# Patient Record
Sex: Female | Born: 1940
Health system: Southern US, Community
[De-identification: ages and names within clinical notes are randomized; demographics above are authoritative.]

## PROBLEM LIST (undated history)

## (undated) DIAGNOSIS — H9190 Unspecified hearing loss, unspecified ear: Secondary | ICD-10-CM

## (undated) DIAGNOSIS — Z974 Presence of external hearing-aid: Secondary | ICD-10-CM

## (undated) DIAGNOSIS — M1990 Unspecified osteoarthritis, unspecified site: Secondary | ICD-10-CM

## (undated) DIAGNOSIS — C801 Malignant (primary) neoplasm, unspecified: Secondary | ICD-10-CM

## (undated) DIAGNOSIS — K219 Gastro-esophageal reflux disease without esophagitis: Secondary | ICD-10-CM

## (undated) HISTORY — PX: TONSILLECTOMY: SUR1361

## (undated) HISTORY — PX: INNER EAR SURGERY: SHX679

## (undated) HISTORY — PX: JOINT REPLACEMENT: SHX530

---

## 1997-09-02 HISTORY — PX: FOOT SURGERY: SHX648

## 1998-12-14 ENCOUNTER — Ambulatory Visit (HOSPITAL_COMMUNITY): Admission: RE | Admit: 1998-12-14 | Discharge: 1998-12-14 | Payer: Self-pay | Admitting: Family Medicine

## 1998-12-14 ENCOUNTER — Encounter: Payer: Self-pay | Admitting: Family Medicine

## 1999-03-12 ENCOUNTER — Ambulatory Visit (HOSPITAL_BASED_OUTPATIENT_CLINIC_OR_DEPARTMENT_OTHER): Admission: RE | Admit: 1999-03-12 | Discharge: 1999-03-12 | Payer: Self-pay | Admitting: General Surgery

## 1999-09-19 ENCOUNTER — Other Ambulatory Visit: Admission: RE | Admit: 1999-09-19 | Discharge: 1999-09-19 | Payer: Self-pay | Admitting: Obstetrics and Gynecology

## 2001-04-28 ENCOUNTER — Other Ambulatory Visit: Admission: RE | Admit: 2001-04-28 | Discharge: 2001-04-28 | Payer: Self-pay | Admitting: Family Medicine

## 2004-10-23 ENCOUNTER — Other Ambulatory Visit: Admission: RE | Admit: 2004-10-23 | Discharge: 2004-10-23 | Payer: Self-pay | Admitting: Family Medicine

## 2004-12-13 ENCOUNTER — Ambulatory Visit (HOSPITAL_COMMUNITY): Admission: RE | Admit: 2004-12-13 | Discharge: 2004-12-13 | Payer: Self-pay | Admitting: Gastroenterology

## 2004-12-13 ENCOUNTER — Encounter (INDEPENDENT_AMBULATORY_CARE_PROVIDER_SITE_OTHER): Payer: Self-pay | Admitting: Specialist

## 2005-12-24 ENCOUNTER — Encounter: Admission: RE | Admit: 2005-12-24 | Discharge: 2005-12-24 | Payer: Self-pay | Admitting: Orthopedic Surgery

## 2005-12-26 ENCOUNTER — Inpatient Hospital Stay (HOSPITAL_COMMUNITY): Admission: RE | Admit: 2005-12-26 | Discharge: 2005-12-30 | Payer: Self-pay | Admitting: Orthopedic Surgery

## 2006-03-25 ENCOUNTER — Encounter: Admission: RE | Admit: 2006-03-25 | Discharge: 2006-03-25 | Payer: Self-pay | Admitting: Thoracic Surgery

## 2006-04-10 ENCOUNTER — Encounter (INDEPENDENT_AMBULATORY_CARE_PROVIDER_SITE_OTHER): Payer: Self-pay | Admitting: Specialist

## 2006-04-10 ENCOUNTER — Inpatient Hospital Stay (HOSPITAL_COMMUNITY): Admission: RE | Admit: 2006-04-10 | Discharge: 2006-04-14 | Payer: Self-pay | Admitting: Thoracic Surgery

## 2006-04-23 ENCOUNTER — Encounter: Admission: RE | Admit: 2006-04-23 | Discharge: 2006-04-23 | Payer: Self-pay | Admitting: Thoracic Surgery

## 2006-05-13 ENCOUNTER — Encounter: Admission: RE | Admit: 2006-05-13 | Discharge: 2006-05-13 | Payer: Self-pay | Admitting: Thoracic Surgery

## 2006-07-15 ENCOUNTER — Encounter: Admission: RE | Admit: 2006-07-15 | Discharge: 2006-07-15 | Payer: Self-pay | Admitting: Thoracic Surgery

## 2006-09-23 ENCOUNTER — Other Ambulatory Visit: Admission: RE | Admit: 2006-09-23 | Discharge: 2006-09-23 | Payer: Self-pay | Admitting: Family Medicine

## 2007-02-04 ENCOUNTER — Encounter: Admission: RE | Admit: 2007-02-04 | Discharge: 2007-02-04 | Payer: Self-pay | Admitting: Thoracic Surgery

## 2007-02-04 ENCOUNTER — Ambulatory Visit: Payer: Self-pay | Admitting: Thoracic Surgery

## 2007-08-05 ENCOUNTER — Ambulatory Visit: Payer: Self-pay | Admitting: Thoracic Surgery

## 2007-08-05 ENCOUNTER — Encounter: Admission: RE | Admit: 2007-08-05 | Discharge: 2007-08-05 | Payer: Self-pay | Admitting: Thoracic Surgery

## 2008-01-27 ENCOUNTER — Other Ambulatory Visit: Admission: RE | Admit: 2008-01-27 | Discharge: 2008-01-27 | Payer: Self-pay | Admitting: Obstetrics and Gynecology

## 2008-03-15 ENCOUNTER — Encounter: Admission: RE | Admit: 2008-03-15 | Discharge: 2008-03-15 | Payer: Self-pay | Admitting: Sports Medicine

## 2008-04-07 ENCOUNTER — Encounter: Admission: RE | Admit: 2008-04-07 | Discharge: 2008-04-07 | Payer: Self-pay | Admitting: Sports Medicine

## 2008-09-02 HISTORY — PX: BACK SURGERY: SHX140

## 2008-09-23 ENCOUNTER — Ambulatory Visit (HOSPITAL_COMMUNITY): Admission: RE | Admit: 2008-09-23 | Discharge: 2008-09-24 | Payer: Self-pay | Admitting: Orthopaedic Surgery

## 2009-07-04 ENCOUNTER — Emergency Department (HOSPITAL_COMMUNITY): Admission: EM | Admit: 2009-07-04 | Discharge: 2009-07-04 | Payer: Self-pay | Admitting: Emergency Medicine

## 2009-11-01 ENCOUNTER — Other Ambulatory Visit: Admission: RE | Admit: 2009-11-01 | Discharge: 2009-11-01 | Payer: Self-pay | Admitting: Obstetrics and Gynecology

## 2010-05-29 ENCOUNTER — Other Ambulatory Visit: Admission: RE | Admit: 2010-05-29 | Discharge: 2010-05-29 | Payer: Self-pay | Admitting: Obstetrics and Gynecology

## 2010-09-06 LAB — URINE MICROSCOPIC-ADD ON

## 2010-09-06 LAB — URINALYSIS, ROUTINE W REFLEX MICROSCOPIC
Bilirubin Urine: NEGATIVE
Hemoglobin, Urine: NEGATIVE
Ketones, ur: NEGATIVE mg/dL
Nitrite: NEGATIVE
Protein, ur: NEGATIVE mg/dL
Specific Gravity, Urine: 1.009 (ref 1.005–1.030)
Urine Glucose, Fasting: NEGATIVE mg/dL
Urobilinogen, UA: 0.2 mg/dL (ref 0.0–1.0)
pH: 6 (ref 5.0–8.0)

## 2010-09-06 LAB — COMPREHENSIVE METABOLIC PANEL
ALT: 20 U/L (ref 0–35)
AST: 20 U/L (ref 0–37)
Albumin: 4.2 g/dL (ref 3.5–5.2)
Alkaline Phosphatase: 68 U/L (ref 39–117)
BUN: 14 mg/dL (ref 6–23)
CO2: 28 mEq/L (ref 19–32)
Calcium: 9.6 mg/dL (ref 8.4–10.5)
Chloride: 103 mEq/L (ref 96–112)
Creatinine, Ser: 0.78 mg/dL (ref 0.4–1.2)
GFR calc Af Amer: >60 mL/min (ref >60)
GFR calc non Af Amer: >60 mL/min (ref >60)
Glucose, Bld: 102 mg/dL — ABNORMAL HIGH (ref 70–99)
Potassium: 4.6 mEq/L (ref 3.5–5.1)
Sodium: 139 mEq/L (ref 135–145)
Total Bilirubin: 0.5 mg/dL (ref 0.3–1.2)
Total Protein: 7 g/dL (ref 6.0–8.3)

## 2010-09-06 LAB — CBC
HCT: 42.1 % (ref 36.0–46.0)
Hemoglobin: 14 g/dL (ref 12.0–15.0)
MCH: 31.7 pg (ref 26.0–34.0)
MCHC: 33.3 g/dL (ref 30.0–36.0)
MCV: 95.5 fL (ref 78.0–100.0)
Platelets: 227 10*3/uL (ref 150–400)
RBC: 4.41 MIL/uL (ref 3.87–5.11)
RDW: 15.2 % (ref 11.5–15.5)
WBC: 6 10*3/uL (ref 4.0–10.5)

## 2010-09-06 LAB — TYPE AND SCREEN
ABO/RH(D): A NEG
Antibody Screen: NEGATIVE

## 2010-09-06 LAB — APTT: aPTT: 33 seconds (ref 24–37)

## 2010-09-06 LAB — PROTIME-INR
INR: 0.93 (ref 0.00–1.49)
Prothrombin Time: 12.7 seconds (ref 11.6–15.2)

## 2010-09-12 ENCOUNTER — Inpatient Hospital Stay (HOSPITAL_COMMUNITY)
Admission: RE | Admit: 2010-09-12 | Discharge: 2010-09-15 | Payer: Self-pay | Source: Home / Self Care | Attending: Orthopedic Surgery | Admitting: Orthopedic Surgery

## 2010-09-17 LAB — BASIC METABOLIC PANEL
BUN: 8 mg/dL (ref 6–23)
BUN: 6 mg/dL (ref 6–23)
BUN: 6 mg/dL (ref 6–23)
CO2: 27 mEq/L (ref 19–32)
CO2: 28 mEq/L (ref 19–32)
CO2: 28 mEq/L (ref 19–32)
Calcium: 8.1 mg/dL — ABNORMAL LOW (ref 8.4–10.5)
Calcium: 8.2 mg/dL — ABNORMAL LOW (ref 8.4–10.5)
Calcium: 8.3 mg/dL — ABNORMAL LOW (ref 8.4–10.5)
Chloride: 102 mEq/L (ref 96–112)
Chloride: 102 mEq/L (ref 96–112)
Chloride: 101 mEq/L (ref 96–112)
Creatinine, Ser: 0.73 mg/dL (ref 0.4–1.2)
Creatinine, Ser: 0.73 mg/dL (ref 0.4–1.2)
Creatinine, Ser: 0.61 mg/dL (ref 0.4–1.2)
GFR calc Af Amer: >60 mL/min (ref >60)
GFR calc Af Amer: >60 mL/min (ref >60)
GFR calc Af Amer: >60 mL/min (ref >60)
GFR calc non Af Amer: >60 mL/min (ref >60)
GFR calc non Af Amer: >60 mL/min (ref >60)
GFR calc non Af Amer: >60 mL/min (ref >60)
Glucose, Bld: 122 mg/dL — ABNORMAL HIGH (ref 70–99)
Glucose, Bld: 128 mg/dL — ABNORMAL HIGH (ref 70–99)
Glucose, Bld: 125 mg/dL — ABNORMAL HIGH (ref 70–99)
Potassium: 4.6 mEq/L (ref 3.5–5.1)
Potassium: 4.2 mEq/L (ref 3.5–5.1)
Potassium: 4.2 mEq/L (ref 3.5–5.1)
Sodium: 134 mEq/L — ABNORMAL LOW (ref 135–145)
Sodium: 135 mEq/L (ref 135–145)
Sodium: 135 mEq/L (ref 135–145)

## 2010-09-17 LAB — CBC
HCT: 32.4 % — ABNORMAL LOW (ref 36.0–46.0)
HCT: 29.1 % — ABNORMAL LOW (ref 36.0–46.0)
HCT: 28.5 % — ABNORMAL LOW (ref 36.0–46.0)
Hemoglobin: 10.5 g/dL — ABNORMAL LOW (ref 12.0–15.0)
Hemoglobin: 9.5 g/dL — ABNORMAL LOW (ref 12.0–15.0)
Hemoglobin: 9.5 g/dL — ABNORMAL LOW (ref 12.0–15.0)
MCH: 30.8 pg (ref 26.0–34.0)
MCH: 31.1 pg (ref 26.0–34.0)
MCH: 31.6 pg (ref 26.0–34.0)
MCHC: 32.4 g/dL (ref 30.0–36.0)
MCHC: 32.6 g/dL (ref 30.0–36.0)
MCHC: 33.3 g/dL (ref 30.0–36.0)
MCV: 95 fL (ref 78.0–100.0)
MCV: 95.4 fL (ref 78.0–100.0)
MCV: 94.7 fL (ref 78.0–100.0)
Platelets: 213 10*3/uL (ref 150–400)
Platelets: 185 10*3/uL (ref 150–400)
Platelets: 208 10*3/uL (ref 150–400)
RBC: 3.41 MIL/uL — ABNORMAL LOW (ref 3.87–5.11)
RBC: 3.05 MIL/uL — ABNORMAL LOW (ref 3.87–5.11)
RBC: 3.01 MIL/uL — ABNORMAL LOW (ref 3.87–5.11)
RDW: 14.9 % (ref 11.5–15.5)
RDW: 15.1 % (ref 11.5–15.5)
RDW: 15.3 % (ref 11.5–15.5)
WBC: 9.1 10*3/uL (ref 4.0–10.5)
WBC: 9.8 10*3/uL (ref 4.0–10.5)
WBC: 10 10*3/uL (ref 4.0–10.5)

## 2010-09-17 LAB — PROTIME-INR
INR: 1.09 (ref 0.00–1.49)
INR: 1.65 — ABNORMAL HIGH (ref 0.00–1.49)
INR: 1.73 — ABNORMAL HIGH (ref 0.00–1.49)
Prothrombin Time: 14.3 seconds (ref 11.6–15.2)
Prothrombin Time: 19.7 seconds — ABNORMAL HIGH (ref 11.6–15.2)
Prothrombin Time: 20.4 seconds — ABNORMAL HIGH (ref 11.6–15.2)

## 2010-09-26 NOTE — Op Note (Addendum)
NAMEDAJA, [name] NO.:  [id number]  MEDICAL RECORD NO.:  [id number]          PATIENT TYPE:  INP  LOCATION:  6962                         FACILITY:  [facility]  PHYSICIAN:  Loreta Ave, M.D. DATE OF BIRTH:  06-Nov-1940  DATE OF PROCEDURE:  09/12/2010 DATE OF DISCHARGE:                              OPERATIVE REPORT   PREOPERATIVE DIAGNOSIS:  Right knee end-stage degenerative arthritis of valgus alignment.  POSTOPERATIVE DIAGNOSIS:  Right knee end-stage degenerative arthritis of valgus alignment.  PROCEDURE:  Modified minimally invasive right total knee replacement, Stryker triathlon prosthesis.  Soft tissue balancing.  Cemented pegged posterior stabilized #4 femoral component.  Cemented #4 tibial component, 11-mm polyethylene insert.  Resurfacing 35-mm patellar component.  SURGEON:  Loreta Ave, MD  ASSISTANT:  Genene Churn. Barry Dienes, [place], present throughout the entire case necessary for timely completion of procedure.  ANESTHESIA:  General.  BLOOD LOSS:  Minimal.  SPECIMENS:  None.  CULTURES:  None.  COMPLICATIONS:  None.  DRESSING:  Sterile compressive, knee immobilizer.  DRAINS:  Hemovac x1.  TOURNIQUET TIME:  One hour and ten minutes.  PROCEDURE:  The patient was brought to the operating room, placed on the operating table in supine position.  After adequate anesthesia had been obtained, knee examined.  Increased valgus partially correctable. Almost full extension, flexion 100 degrees.  Tourniquet applied. Prepped and draped in the usual sterile fashion.  Exsanguinated with elevation Esmarch, tourniquet inflated at 350 mmHg.  Anterior incision above the patella down to tibial tubercle.  Medial arthrotomy, vastus splitting preserving quad tendon.  Knee exposed.  Grade 4 change throughout.  Remnants of menisci, cruciate ligaments, and loose bodies, periarticular spurs removed.  Intramedullary guide on the femur.  Distal cut 5 degrees of  valgus, 10-mm resection.  Using epicondylar axis, sized, cut, and fitted for posterior stabilized #4 femoral component. Extramedullary guide on the tibia below the defect laterally, sized to #4 component.  Some subchondral cyst debrided out on the lateral tibial plateau.  Some sclerotic bone there treated with multiple drilling.  All recess examined to be sure all debris and loose fragments removed. Patella exposed.  Posterior 10 mm removed.  Drilled, sized, and fitted for a 35-mm component.  Trials put in place throughout.  With a #4 components above and below and 11-mm insert and a 35 patella, I had an excellent mechanical axis, good stability, good motion, and excellent patellofemoral tracking.  Tibia was marked for rotation and reamed.  All trials removed.  Copious irrigation with a pulse irrigating device. Cement prepared and placed on all components firmly seated. Polyethylene attached to tibia, the knee reduced.  Patella held with clamp.  Once cement hardened, knee was reexamined.  Very pleased alignment, stability, mechanical axis, and patellofemoral tracking. Hemovac was placed through a separate stab wound.  Arthrotomy closed with #1 Vicryl, skin and subcutaneous tissue with Vicryl and staples. Sterile compressive dressing applied.  Tourniquet deflated, removed. Knee immobilizer applied.  Anesthesia reversed.  Brought to recovery room.  Tolerated surgery well.  No complications.     Loreta Ave, M.D.     DFM/MEDQ  D:  09/12/2010  T:  09/13/2010  Job:  [id number]  Electronically Signed by Mckinley Jewel M.D. on 09/26/2010 04:21:15 PM

## 2010-09-27 LAB — SURGICAL PCR SCREEN
MRSA, PCR: NEGATIVE
Staphylococcus aureus: NEGATIVE

## 2010-10-22 NOTE — Discharge Summary (Signed)
[name], NOVITSKI NO.:  Ali  MEDICAL RECORD NO.:  Ali          PATIENT TYPE:  INP  LOCATION:  1610                         FACILITY:  [facility]  PHYSICIAN:  Loreta Ave, M.D. DATE OF BIRTH:  1941-07-14  DATE OF ADMISSION:  09/12/2010 DATE OF DISCHARGE:  09/15/2010                              DISCHARGE SUMMARY   FINAL DIAGNOSIS:  Status post right total knee replacement for end-stage degenerative joint disease.  HISTORY OF PRESENT ILLNESS:  A 71 year old white female with history of end-stage DJD right knee and chronic pain who presented to our office for preop evaluation for total knee replacement.  She had progressively worsening pain with no response with conservative treatment. Significant decrease in her daily activities due to the ongoing complaint.  HOSPITAL COURSE:  On September 12, 2010, the patient was taken to the [facility] OR and a right total knee replacement procedure performed. Surgeon was Loreta Ave, MD and assistant, Genene Churn. Barry Dienes, PA-C. Anesthesia general.  Minimal blood loss.  No specimens.  Tourniquet time was 70 minutes.  There were no surgical or anesthesia complications and the patient was transferred to recovery in stable condition.  On September 13, 2010, the patient doing well without complaints.  Temperature 99, pulse 74, respirations 18, blood pressure 123/70.  Slight bleeding through her dressing.  Calf nontender.  Neurovascularly intact.  PT, OT consults.  Discontinued PCA.  Pharmacy protocol Coumadin and Lovenox started for DVT prophylaxis.  On September 14, 2010, the patient doing well with good pain control.  No complaints.  Hemoglobin 9.5, hematocrit 29.1, INR 1.65.  Knee wound looks good and staples intact.  No drainage or sign of infection.  Hemovac drain removed.  Calf nontender.  Saline locked IV.  Discontinued Foley.  Progressing with rehab.  On September 15, 2010, the patient doing great without  complaints.  States that she is ready to go home.  Hemoglobin 9.5, hematocrit 28.5, INR 1.73.  Knee wound looks good and staples intact.  No drainage or sign of infection. Calf nontender.  Neurovascularly intact.  Skin intact.  MEDICATIONS: 1. Dilaudid 2 mg 1 or 2 tabs p.o. q.4-6 h. for pain. 2. Lovenox 30 mg one subcu injection q.12 h. and discontinue when     Coumadin is therapeutic with INR 2-3. 3. Robaxin 500 mg 1 tablet p.o. q.6 h. p.r.n. for spasms. 4. Coumadin per pharmacy protocol.  Home health agency to dose and     maintain INR 2-3 x4 weeks postop for DVT prophylaxis. 5. Resume previous home meds.  CONDITION:  Good and stable.  DISPOSITION:  Discharged home.  INSTRUCTIONS:  While at home, the patient will continue to work with PT and OT to improve ambulation and knee range of motion and strengthening. Weight bear as tolerated.  Daily dressing changes with 4 x 4 gauze and tape.  Coumadin x4 weeks postop DVT prophylaxis.  Follow up 2 weeks postop for recheck and staple removal.  Return sooner as needed.   Genene Churn. Denton Meek.   ______________________________ Loreta Ave, M.D.    JMO/MEDQ  D:  10/03/2010  T:  10/04/2010  Job:  [id number]  Electronically Signed by Zonia Kief P.A. on 10/19/2010 04:03:07 PM Electronically Signed by Mckinley Jewel M.D. on 10/22/2010 02:58:10 PM

## 2010-11-13 ENCOUNTER — Other Ambulatory Visit: Payer: Self-pay | Admitting: Orthopedic Surgery

## 2010-11-13 ENCOUNTER — Ambulatory Visit (HOSPITAL_BASED_OUTPATIENT_CLINIC_OR_DEPARTMENT_OTHER)
Admission: RE | Admit: 2010-11-13 | Discharge: 2010-11-13 | Disposition: A | Discharge status: Home / Self Care | Payer: BC Managed Care – PPO | Source: Ambulatory Visit | Attending: Orthopedic Surgery | Admitting: Orthopedic Surgery

## 2010-11-13 DIAGNOSIS — M6740 Ganglion, unspecified site: Secondary | ICD-10-CM | POA: Insufficient documentation

## 2010-11-13 DIAGNOSIS — D1801 Hemangioma of skin and subcutaneous tissue: Secondary | ICD-10-CM | POA: Insufficient documentation

## 2010-11-15 NOTE — Op Note (Signed)
[name], [name] NO.:  [id number]  MEDICAL RECORD NO.:  [id number]          PATIENT TYPE:  LOCATION:                                 FACILITY:  PHYSICIAN:  [name], M.D. DATE OF BIRTH:  Jan 03, 1941  DATE OF PROCEDURE:  11/13/2010 DATE OF DISCHARGE:                              OPERATIVE REPORT   PREOPERATIVE DIAGNOSES: 1. MRI-documented sub-nail mechanism glomus tumor, left index finger     distal phalangeal segment with significant nail plate deformity. 2. Right index finger mucoid cyst with bone-on-bone arthropathy at     distal interphalangeal joint, large marginal osteophytes and likely     loose bodies.  POSTOPERATIVE DIAGNOSES: 1. MRI-documented sub-nail mechanism glomus tumor, left index finger     distal phalangeal segment with significant nail plate deformity. 2. Right index finger mucoid cyst with bone-on-bone arthropathy at     distal interphalangeal joint, large marginal osteophytes and likely     loose bodies with confirmation of loose bodies and large     subcutaneous mucoid cyst, dorsal radial aspect, right index finger     distal interphalangeal joint.  OPERATION: 1. Resection of the sub-nail mechanism glomus tumor, left index finger     distal phalangeal segment. 2. Debridement of right index finger distal interphalangeal joint     through dorsal radial and dorsal ulnar incisions followed by     removal of marginal osteophytes at base of distal phalanx and     irrigation of distal interphalangeal joint. 3. Resection of a subcutaneous mucoid cyst, dorsal radial aspect of     right index finger distal phalangeal segment.  OPERATING SURGEON:  [name]. [name] Rosier, MD  ASSISTANT:  [name] Reeks. Dasnoit, PA-C.  ANESTHESIA:  General by LMA.  SUPERVISING ANESTHESIOLOGIST:  [name] Hora. Gelene Mink, MD  INDICATIONS:  [name] is a 70 year old woman who is referred for evaluation and management by Dr. Campbell Ali 2 years prior for  a nail deformity of the left index finger.  We discussed the likely etiology of her deformity at that time either a mucoid cyst of the nail fold or possible subungual glomus tumor.  [name] elected to postpone treatment for a significant period of time until she developed a dramatic peaked nail deformity and a very tender nail fold.  By the time of her second presentation, she had cold intolerance and all the salient features of a glomus tumor.  We sent her for a preoperative MRI with contrast, which documented the presence of a sub-nail mechanism glomus tumor that was quite substantial in size measuring 6-7 mm in diameter.  We recommended that she proceed with nail plate removal, glomus tumor excision followed by reconstruction of the nail mechanism.  She also noted a painful right index finger DIP joint with a mucoid cyst.  As she was going to be under anesthesia, we recommended proceeding with debridement of the right index finger distal interphalangeal joint with mucoid cyst excision during the same anesthetic event.  After informed consent, she was brought to the operating at this time anticipating 2 separate and discrete procedures.  [name]  [name] was brought to room 6 of the [facility] and placed in supine position on the operating table.  Following informed consent by Dr. Gelene Ali, general anesthesia by LMA technique was recommended and accepted.  Under Dr. Thornton Dales direct supervision, general anesthesia by LMA technique was induced followed by routine Betadine scrub and paint of the right hand and arm and the left hand and arm.  Sterile stockinette and impervious arthroscopy drapes were applied bilaterally.  IV access was obtained to a right antecubital IV catheter.  Procedure commenced with a routine surgical time-out describing both surgical procedures for the right and left hands.  We subsequently exsanguinated the right index finger with a gauze  wrap and placed a 1/2- inch Penrose drain over the proximal phalangeal segment as digital tourniquet.  Paired curvilinear incision were fashioned over the dorsal radial and dorsal ulnar aspects of the distal interphalangeal joint followed by careful subcutaneous dissection protecting the extensor mechanism and the dorsal neurovascular structures.  The collateral ligaments identified followed by triangular resection of the capsule between the terminal extensor tendon slip and the radial collateral ligament and the terminal extensor tendon slip and the ulnar collateral ligament.  A microcurette was used to remove marginal osteophytes as well as a rongeur followed by through-and-through irrigation of the joint.  Multiple cartilaginous loose bodies were debrided.  Thereafter, the radial incision was extended slightly distally and with careful subcutaneous dissection, the mucoid cyst was excised with retrograde technique pulling the membrane of the cyst proximally and draining its contents.  Further irrigation and curettage, obliterated all remnants of the cyst membrane.  The wound was then repaired both dorsal radially and dorsal ulnarly with trauma sutures of 5-0 nylon.  The finger was dressed with Xeroflo sterile gauze and Coban with release of the digital tourniquet.  Attention was then directed to the left index finger.  The left index finger was similarly exsanguinated the gauze wrap and 0.5-inch Penrose drain placed as a digital tourniquet of the proximal phalangeal segment. After routine surgical time-out, the nail plate was elevated with a sharp Therapist, nutritional followed by identification of the glomus tumor by reference to the MRI and direct inspection.  One can see a bluish purple discoloration of the germinal nail matrix.  This was meticulously incised with a 15 scalpel blade and with use of a micro Freer and micro curette, we circumferentially dissected a 6 x 6-mm classic glomus  tumor, which extruded through the nail matrix.  We subsequently elevated the tumor and identified its vessel and nerve.  With approximately 1 mm of margin, the vessel was electrocauterized with bipolar micro forceps as was the nerve element.  After irrigation and curettage of the periosteum on the dorsum of the distal phalanx, we repaired the nail matrix incision with interrupted suture of 6-0 chromic.  The nail plate which had been soaking for 15 minutes and Betadine was cleared of all soft tissues, replaced beneath the nail fold and the finger dressed with Xeroflo sterile gauze and Coban.  [name] then had a right index finger and a left index finger digital block placed with 2% lidocaine to ameliorate her postoperative pain. There were no apparent complications.  She was awakened from general anesthesia and transferred to the recovery room in stable signs.  She will be discharged with a prescriptions for Dilaudid 2 mg 1-2 tablets p.o. q.4-6 hours p.r.n. pain, 30 tablets without refill also; Keflex 500 mg 1 p.o. q.8 h x4 days as a prophylactic  antibiotic.     [name] Akaash Vandewater, M.D.     RVS/MEDQ  D:  11/13/2010  T:  11/14/2010  Job:  [id number]  cc:   Hope M. Danella Deis, M.D.  Electronically Signed by Josephine Igo M.D. on 11/15/2010 12:53:05 PM

## 2010-12-17 LAB — HEMOGLOBIN AND HEMATOCRIT, BLOOD
HCT: 28 % — ABNORMAL LOW (ref 36.0–46.0)
Hemoglobin: 9.5 g/dL — ABNORMAL LOW (ref 12.0–15.0)

## 2010-12-17 LAB — BASIC METABOLIC PANEL
BUN: 9 mg/dL (ref 6–23)
CO2: 26 mEq/L (ref 19–32)
CO2: 28 mEq/L (ref 19–32)
Calcium: 9.6 mg/dL (ref 8.4–10.5)
Chloride: 101 mEq/L (ref 96–112)
GFR calc Af Amer: >60 mL/min (ref >60)
GFR calc non Af Amer: >60 mL/min (ref >60)
Glucose, Bld: 93 mg/dL (ref 70–99)
Potassium: 3.8 mEq/L (ref 3.5–5.1)

## 2010-12-17 LAB — CBC
HCT: 28.3 % — ABNORMAL LOW (ref 36.0–46.0)
MCHC: 33.7 g/dL (ref 30.0–36.0)
Platelets: 192 10*3/uL (ref 150–400)
Platelets: 281 10*3/uL (ref 150–400)
RBC: 3.1 MIL/uL — ABNORMAL LOW (ref 3.87–5.11)
RDW: 15.4 % (ref 11.5–15.5)
WBC: 5.4 10*3/uL (ref 4.0–10.5)

## 2010-12-17 LAB — DIFFERENTIAL
Basophils Absolute: 0 10*3/uL (ref 0.0–0.1)
Basophils Relative: 1 % (ref 0–1)
Eosinophils Relative: 2 % (ref 0–5)
Lymphocytes Relative: 40 % (ref 12–46)
Neutro Abs: 3.2 10*3/uL (ref 1.7–7.7)

## 2010-12-17 LAB — APTT: aPTT: 38 seconds — ABNORMAL HIGH (ref 24–37)

## 2010-12-17 LAB — PROTIME-INR: Prothrombin Time: 14 seconds (ref 11.6–15.2)

## 2011-01-15 NOTE — Letter (Signed)
August 05, 2007   Stacie Acres. Cliffton Asters, M.D.  [address]  [place], [place] [address]   Re:  [name], [name]                 DOB:  1941/07/23   Dear Dr. Cliffton Asters:   I saw [name] back in the office today. Her pulse was 98, saturations  were 94%, respirations 18. Lungs were clear to auscultation and  percussion.   Chest x-ray showed normal with no evidence of recurrence of  herSchwannoma.   I think that she is doing well. Overall, she has some twitching of the  muscles in her back, but otherwise she is stable.  I will be happy to  see her if she has any future problems. I recommended that she get a  chest x-ray at least once a year.   Ines Bloomer, M.D.  Electronically Signed   DPB/MEDQ  D:  08/05/2007  T:  08/06/2007  Job:  [id number]

## 2011-01-15 NOTE — Op Note (Signed)
[name], [name]                 ACCOUNT NO.:  [id number]   MEDICAL RECORD NO.:  [id number]          PATIENT TYPE:  OIB   LOCATION:  5031                         FACILITY:  [facility]   PHYSICIAN:  Mark C. Ophelia Charter, M.D.    DATE OF BIRTH:  02-06-1941   DATE OF PROCEDURE:  09/23/2008  DATE OF DISCHARGE:                               OPERATIVE REPORT   PREOPERATIVE DIAGNOSIS:  L2-3 central stenosis.   POSTOPERATIVE DIAGNOSIS:  L2-3 central stenosis.   PROCEDURE:  L2-3 decompression.   SURGEON:  Mark C. Ophelia Charter, MD   ASSISTANT:  Wende Neighbors, PA   ANESTHESIA:  GOT.   ESTIMATED BLOOD LOSS:  850.   DRAINS:  None.   COMPLICATIONS:  Dural tear with repair with 5 interrupted Nurolon  sutures with Tisseel.   This 70 year old female who is approximately 109 or 110 kg has had  progressive increase of spinal stenosis with inability to stand more  than a few minutes, has had MRI scan, which showed stenosis at the L2-3  level and with time as had progressive increased pain, shorter time to  stand and increased numbness, weakness in her legs that requires her to  stop and rest.  This is affecting her daily life activities.   After induction of general anesthesia, the patient was placed prone on  chest rolls carefully padding and positioning.  Back was prepped with  DuraPrep, they were squared with towels.  Betadine bio drape was given,  2 g of Ancef was given prophylactically and surgical time-out checklist  was completed.  MRI scan was on the desk, which showed the T3 stenosis  at the level of the disk.  Spinal needle was placed based on bony  landmark palpation, which was somewhat difficult due to the body habitus  and a spinal needle was above the L2-3 level.  Needle was at the upper  level of the L2 pedicle, incision was made starting at the level of the  spinal needle distally in the midline dissection down through the deep  status post layer to the spinous process was performed.   Subperiosteal  dissection on both sides with the use of the Bovie electrocautery, the  deep Bose retractor with extra deep blades were applied and 2 Kocher  clamps were placed at the planned expected level for decompression and  cross-table lateral x-ray was taken with the Kocher clamps down on the  lamina showing this was appropriate.  Spinous process was removed,  lamina was done and using a Kerrison, thick chunks of ligaments were  removed.  Laminotomy was performed on each side with near complete  laminectomy leaving the top aspect of L2.  There was hour glass  deformity with hypertrophic ligament and on the left side, there was a  sharp spur present.  This either appeared to be tightness of the top  aspect of the L3 lamina despite the MRI scan that did not look that  tight and may have been with the decompression at the L2-3 level.  The  dura was bulging out.  Top edge of L3 was  trimmed back with paddy's used  to protect the dura and dural tear occurred at the midline.  Additional  lamina had to be removed at L3 exposing the dura and there was an 8-mm  tear.  Nerve roots were had to be tucked in and one piece of the nerve  was not in continuity was tucked in.  Interrupted Nurolon was used side-  to-side with #4 Penfield to use to tuck the nerves.  Tisseel was applied  over the top and Valsalva 30 cm water showed no leak.  There was an area  over the left dorsal aspect of the nerve root where the dura was thin.  Some Tisseel was applied in this area as well.  There was numerous  epidural bleeders and combination of both FloSeal was well as thrombin-  soaked Gelfoam and paddies were used in both gutters.  These had to be  exchanged and changed multiple times during the case in order to  decrease the bleeding, so that the dural repair could be accomplished as  described above.  At the end of the case, the epidural veins were then  controlled.  The disk had been inspected.  Some epidural  veins and both  gutters had been coagulated with the SILVERGlide bipolar tips that were  insulated.  There was still some bleeding on the right distally, a  thrombin-soaked Gelfoam and paddy was placed.  Few minutes waited and  then this area was dry enough for closure.  Deep fascia was closed with  interrupted 0 Vicryl, 2-0 Vicryl, and subcutaneous tissue.  She had  subcuticular skin closure.  Tincture of Benzoin, Marcaine infiltration,  and Steri-Strips.  Due to the excess of epidural bleeding during the  case and length of time it took for dural repair.  The patient was  turned over and Foley was inserted.  In the recovery room, [name] anterior  tib, and gastroc soleus was drawn.  She is able to lift both legs and  had good hip flexion.      Mark C. Ophelia Charter, M.D.  Electronically Signed     MCY/MEDQ  D:  09/23/2008  T:  09/24/2008  Job:  [id number]

## 2011-01-15 NOTE — Assessment & Plan Note (Signed)
OFFICE VISIT   BETHANIE, BLOXOM  DOB:  17-Dec-1940                                        February 04, 2007  CHART #:  [id number]   Her blood pressure is 132/70.  Pulse 72.  Respirations 18.  Sats are  95%.   Her chest x-ray showed no evidence of recurrence.  She has some mild  pain.   I will plan to see her back again in six months with another chest x-  ray.  At that time, I will probably refer her back to her own medical  doctor.  I did tell her that she needs to have a chest x-ray at least  once a year for the rest of her life.   Ines Bloomer, M.D.  Electronically Signed   DPB/MEDQ  D:  02/04/2007  T:  02/04/2007  Job:  [id number]

## 2011-01-18 ENCOUNTER — Other Ambulatory Visit: Payer: Self-pay | Admitting: Otolaryngology

## 2011-01-18 NOTE — Op Note (Signed)
[name], [name]                 ACCOUNT NO.:  [id number]   MEDICAL RECORD NO.:  [id number]          PATIENT TYPE:  AMB   LOCATION:  ENDO                         FACILITY:  [facility]   PHYSICIAN:  Petra Kuba, M.D.    DATE OF BIRTH:  23-Jul-1941   DATE OF PROCEDURE:  12/13/2004  DATE OF DISCHARGE:                                 OPERATIVE REPORT   PROCEDURE:  Colonoscopy.   INDICATIONS:  Screening.   Consent was signed after risks, benefits, methods, and options were  thoroughly discussed in the office by my nurse.   MEDICINES USED:  Demerol 70, Versed 6.   PROCEDURE:  Rectal inspection was pertinent for external hemorrhoids, small.  Digital exam was negative. The video pediatric adjustable colonoscope was  inserted and despite a tortuous colon with abdominal pressure was able to be  advanced to the cecum. Scattered left-sided diverticula were seen on  insertion. The ileocecal valve did have a shallow ulcer on the top of it  which was cold biopsied on withdrawal. The scope was inserted short ways  into the terminal ileum which was normal. The cecal pole was normal. The  appendiceal orifice was seen. The scope was slowly withdrawn. The prep was  adequate. There was some liquid stool that required washing and suctioning.  On slow withdrawal through the colon, other than left-sided scattered  diverticula, no other abnormalities were seen as we slowly withdrew back to  the rectum. Anorectal pull-through and retroflexion confirmed some small  hemorrhoids. Scope was straightened and readvanced short ways up the left  side of the colon, air was suctioned, scope removed. The patient tolerated  the procedure well. There was no obvious immediate complication.   ENDOSCOPIC DIAGNOSIS:  1.  Internal and external hemorrhoids.  2.  Left occasional diverticula.  3.  IC valve shallow ulcer status post biopsy.  4.  Otherwise within normal limits to the terminal ileum.   PLAN:  Await pathology.  Probably Repeat screening in five years. Happy to  see back p.r.n. otherwise return care Dr. Cliffton Asters for the customary health  care maintenance to include yearly rectals and guaiacs.      MEM/MEDQ  D:  12/13/2004  T:  12/13/2004  Job:  [id number]

## 2011-01-18 NOTE — Op Note (Signed)
NAMEJAMAIRA, SHERK NO.:  [id number]   MEDICAL RECORD NO.:  [id number]          PATIENT TYPE:  INP   LOCATION:  2550                         FACILITY:  [facility]   PHYSICIAN:  Loreta Ave, M.D. DATE OF BIRTH:  July 30, 1941   DATE OF PROCEDURE:  12/26/2005  DATE OF DISCHARGE:                                 OPERATIVE REPORT   INDICATIONS FOR PROCEDURE:  This patient has end-stage degenerative  arthritis of the left knee. Complicated by morbid obesity. All treatment  alternatives to control pain and function have failed. Brought to the  operating room at this time for a left total knee replacement.   PREOPERATIVE DIAGNOSES:  1.  End-stage degenerative arthritis, left knee.  2.  Morbid obesity.   POSTOPERATIVE DIAGNOSES:  1.  End-stage degenerative arthritis, left knee.  2.  Morbid obesity.   PROCEDURE:  Left total knee replacement, [employer] prosthesis.  Standard approach. Cemented posterior stabilized #4 femoral component.  Cemented #4 tibial component with a 9 mm posterior stabilized polyethylene  insert. Cemented resurfacing 35 mm x 10 mm patellar component. Soft tissue  balancing with medial capsule release.   SURGEON:  Loreta Ave, M.D.   ASSISTANT:  Genene Churn. Barry Dienes, P.A. who was essential in assisting throughout  the entire case and was present for the entire case.   ANESTHESIA:  General.   ESTIMATED BLOOD LOSS:  Minimal.   TOURNIQUET TIME:  1 hour and 30 minutes.   SPECIMENS:  None.   CULTURES:  None.   COMPLICATIONS:  None.   DRESSING:  Soft compressive with knee immobilizer.   DRAINS:  Hemovac x1.   DESCRIPTION OF PROCEDURE:  The patient was brought to the operating room and  after adequate anesthesia had been obtained, left knee examined. Mild  flexion contracture. Alignment of varus correctable to neutral. Stable  ligaments. Flexion is stopping at 100 degrees, part of this being inhibited  by adipose tissue behind the  thigh and calf. Tourniquet applied, prepped and  draped in the usual sterile fashion. All positioning, all surgical  approaches, all exposure made much more difficult because of morbid obesity.  After being prepped and draped in the usual sterile fashion, exsanguinated,  elevation, esmarch, tourniquet inflated to 400 mmHg. A straight incision  above the patella down to the tibial tubercle. The skin and subcutaneous  tissue divided. Hemostasis with cautery. Medial arthrotomy. Medial cuts  released. Grade 4 changes throughout. Paraarticular spurs were __________  menisci, cruciate ligaments, loose bodies. Paraarticular spurs all removed.  Distal femur exposed. The intramedullary guide placed. Distal cut aligning  mechanical axis resecting 10 mm set at 5 degrees of valgus. Epicondylar axis  established. Sized for a #4 component. Jig was put in place.  Definitive  cuts made. The size was perfect for a #4 AP but a little bit tight anterior  to posterior. This involved about a millimeter of skiving cortical bone on  the front but very acceptable alignment. Trial put in place and found to fit  well. Tibia expose. Proximal resection extramedullary guide, 3 degree  posterior slope  cut resecting 3-4 mm off the deficient medial side. Once  that was accomplished, the tibia was sized to a #4 component. Patella  exposed. Posterior 10 mm removed. A size #35 mm medial offset resurfacing  component. Jig was put in place, drill holes made. The knee was copiously  irrigated. All recess examined and completely cleared out. Trial was put in  place. A #4 on the femur, #4 on the tibia. With the 9 mm insert, full  extension, full flexion, nicely balanced knee. Good patellofemoral tracking.  All trials removed. Tibia was marked for rotation with trials. Hand punched.  Cement was then prepared and placed on all components which were firmly  seated. Once the cement hardened, the excessive cement removed.  Polyethylene  attached to the tibia, knee reduced. At completion knee reexamined. Full  extension, full flexion, no lift off in flexion. Good patellofemoral  tracking. A Hemovac placed, brought out through a separate stab wound.  Arthrotomy closed with #1 Vicryl, skin and subcutaneous tissue with #0  Vicryl and staples. __________ knee injected with Marcaine. A sterile  compressive dressing applied. Tourniquet deflated and removed. Knee  immobilizer applied, anesthesia reversed, brought to the recovery room.  Tolerated surgery well with no complications.      Loreta Ave, M.D.  Electronically Signed     DFM/MEDQ  D:  12/26/2005  T:  12/27/2005  Job:  [id number]

## 2011-01-18 NOTE — Discharge Summary (Signed)
NAMESONNIA, STRONG NO.:  [id number]   MEDICAL RECORD NO.:  [id number]          PATIENT TYPE:  INP   LOCATION:  5037                         FACILITY:  [facility]   PHYSICIAN:  Loreta Ave, M.D. DATE OF BIRTH:  [birth year]   DATE OF ADMISSION:  12/26/2005  DATE OF DISCHARGE:  12/30/2005                                 DISCHARGE SUMMARY   FINAL DIAGNOSES:  1.  Status post left total knee replacement for end-stage degenerative joint      disease.  2.  Hyperlipidemia.  3.  Long-term use of anticoagulants.  4.  Morbid obesity.   HISTORY OF PRESENT ILLNESS:  This 70 year old white female presents with end-  stage DJD left knee and chronic pain who presented to our office for preop  evaluation.  She had progressive worsening pain with failed response with  conservative treatment.  Significant decrease in her daily activities due to  the ongoing complaint.   HOSPITAL COURSE:  On 26 December 2005, the patient was taken to the [facility]  OR, and a left total knee replacement procedure was performed.  Surgeon:  Loreta Ave, M.D. Assistant: Dimple Casey, P.A.-C.  Anesthesia:  General.  No specimens.  EBL: Minimal.  Tourniquet time: 1 hour 55 minutes.  Hemovac drains x1 placed.  There were no surgical or anesthesia  complications, and patient was transferred to recovery in stable condition.  Pharmacy protocol Coumadin started.   On 27 December 2005, good pain control.  Vital signs stable, afebrile.  Hemoglobin 9.8.  INR 1.1.  Some bleeding through dressing.  Hemovac drain  intact. Calf nontender. Neurovascularly intact.  PT and OT consults.   On 28 December 2005, the patient was doing well. Vital signs stable, afebrile.  Hemoglobin 9.1.  Sodium 134, potassium 4.0, chloride 101, CO2 29, BUN 6,  creatinine 0.7, glucose 133. INR 1.6.  Hemovac drain discontinued.  Dressing  changed.  Wound looked good, staples intact.  No signs of infection.  Calf  nontender,  neurovascularly intact.  Discontinued Foley and PCA.  Heparin  locked IV.   On 29 December 2005, the patient was doing well.  Vital signs stable, afebrile.  Hemoglobin 8.9.  Sodium 136, potassium 3.9, chloride 102, CO2 27, BUN 11,  creatinine 0.7, glucose 120.  Wound looked good, staples intact.  No signs  of infection.  Calf nontender, neurovascular intact.  INR 1.7.   On 30 December 2005, the patient was doing well.  Good hall ambulation, ready  to go home.  Vital signs stable, afebrile.  Hemoglobin 8.7.  INR 1.7.  Wound  looked good, staples intact.  No drainage or signs of infection.  Calf  nontender.  Neurovascularly intact.   DISCHARGE MEDICATIONS:  1.  Percocet 5/325 one to two tablets q. 4-6 h p.r.n. pain.  2.  Coumadin per pharmacy protocol x4 weeks for DVT prophylaxis.  3.  FESO4 325 mg 1 tablet p.o. b.i.d. with food.  4.  Robaxin 500 mg 1 tablet p.o. q. 6 h p.r.n. for spasm.   CONDITION ON DISCHARGE:  Good and stable.  DISPOSITION:  Discharged home.   DISCHARGE INSTRUCTIONS:  1.  The patient will work with home health PT and OT.  2.  Home health RN to monitor Coumadin dose and PT/INR.  Will be on Coumadin      x4 weeks postoperatively for DVT prophylaxis.  3.  Dressing changes p.r.n.  4.  Follow up 2 weeks postoperatively for recheck.  Return sooner if needed.     ______________________________  Dimple Casey, P.A.-C.      Loreta Ave, M.D.  Electronically Signed    JM/MEDQ  D:  02/19/2006  T:  02/19/2006  Job:  [id number]

## 2011-01-18 NOTE — H&P (Signed)
NAME:  [name], MOSELY NO.:  [id number]   MEDICAL RECORD NO.:  [id number]          PATIENT TYPE:  INP   LOCATION:  NA                           FACILITY:  [facility]   PHYSICIAN:  Ines Bloomer, M.D. DATE OF BIRTH:  03/19/1941   DATE OF ADMISSION:  04/10/2006  DATE OF DISCHARGE:                                HISTORY & PHYSICAL   HISTORY OF PRESENT ILLNESS:  This 70 year old patient is in good health  otherwise except for arthritis and severe pain in the right knee and  scheduled to have a total knee replacement. At the time of her workup, she  was found to have a 3.3 x 2.9 x 2.3 mass in the T4 area on CT scan.  This  was first seen on chest x-ray.  There is no spinal extension of the mass.  Repeat CT scan done recently showed there is no enlargement since her  original CT scan.  She is being admitted for resection of the thymoma.   PAST MEDICAL HISTORY:  1.  Hypercholesterolemia.  2.  She recently had a total right knee by Dr. Eulah Pont.   MEDICATIONS:  Zocor 40 mg a day.   She is not allergic to any medications.   SOCIAL HISTORY:  She is married, still working.  Does not smoke or drink.   FAMILY HISTORY:  Her father had congestive heart failure.  Negative for  cancer.   REVIEW OF SYSTEMS:  She is 230 pounds.  She is 5 feet 1 inch. Weight has  been stable, although she has lost weight since her knee surgery.  CARDIAC:  She has no angina or atrial fibrillation.  PULMONARY: No hemoptysis or  __________ .  No fever or chills.  GI: No dysphagia, no GERD.  GU: No  dysuria, frequent urination or kidney disease. VASCULAR: No claudication,  DVT, or TIAs.  NEUROLOGIC: No headaches, blackouts, seizures.  ORTHOPEDIC:  See History of Present Illness.  PSYCHIATRIC: No psychiatric illnesses.  EYE  AND ENT: No change in eyesight or hearing.  HEMATOLOGIC:  No problems with  bleeding.   PHYSICAL EXAMINATION:  GENERAL: She is a slightly obese Caucasian female in  no acute  distress.  VITAL SIGNS: Blood pressure 146/70, pulse 60, respirations 18, O2 saturation  99%.  HEAD:  Atraumatic.  EYES:  Pupils equal, round, and reactive to light and accommodation.  Extraocular movements are normal.  EARS: Tympanic membranes intact.  NOSE: There is no septal deviation.  THROAT: Without lesion.  NECK: Supple with no thyromegaly or supraclavicular or axillary adenopathy.  CHEST: Clear to auscultation and percussion.  HEART:  Regular sinus rhythm, no murmurs.  ABDOMEN: Soft.  There is no hepatosplenomegaly.  EXTREMITIES:  Pulses 2+.  There is no clubbing or edema.  She has swelling  over the left knee joint.  NEUROLOGIC:  She is oriented x3.  Sensory and motor intact.  Cranial nerves  intact.   IMPRESSION:  1.  Right T6 thymoma.  2.  Left total knee replacement.  3.  Hypercholesterolemia.  ______________________________  Ines Bloomer, M.D.     DPB/MEDQ  D:  04/07/2006  T:  04/07/2006  Job:  [id number]

## 2011-01-18 NOTE — Op Note (Signed)
NAMEVINCENZINA, [name] NO.:  [id number]   MEDICAL RECORD NO.:  [id number]          PATIENT TYPE:  INP   LOCATION:  3311                         FACILITY:  [facility]   PHYSICIAN:  Ines Bloomer, M.D. DATE OF BIRTH:  July 24, 1941   DATE OF PROCEDURE:  04/10/2006  DATE OF DISCHARGE:                                 OPERATIVE REPORT   SURGEON:  Ines Bloomer, M.D.   FIRST ASSISTANT:  Theda Belfast, P.A.-C.   PREOPERATIVE DIAGNOSIS:  T6 posterior mediastinal mass.   POSTOPERATIVE DIAGNOSIS:  Schwannoma, T6.   OPERATION PERFORMED:  Right video-assisted thoracoscopic surgery,  minithoracotomy, resection of schwannoma.   DESCRIPTION OF PROCEDURE:  After percutaneous insertion of all monitoring  lines, the patient underwent general anesthesia and was turned to the right  lateral thoracotomy position and a double-lumen tube was inserted.  He was  prepped and draped in the usual sterile manner.  Our trocar site was made in  the seventh intercostal space in the anterior axillary line, and at the  eighth intercostal space at the midaxillary line.  A 30-degree scope was  inserted.  The lung was deflated, and we could see the lesion in the  posterior mediastinum.  At the T6 level.  A 5-cm incision was made over the  sixth intercostal space, with the latissimus being partially divided, the  serratus reflected anteriorly in the triangle of auscultation.  The fifth  intercostal space was opened and dissection was carried down to the lesion.  Dissection was started around the lesion with electrocautery, dissecting up,  and we ended up clipping and dividing 2 branches of the intercostal artery  and vein with clips proximally and distally.  We then carefully with  electrocautery dissected the tumor off the ribs and off the vertebral  column, starting inferiorly and dissecting superiorly until we were able to  dissect it off completely.  We then removed the mass and frozen  section  revealed a schwannoma.  A chest tube was placed through the eighth  intercostal trocar site.  A Marcaine block was done in the usual fashion,  and the On-Q catheter was placed in the usual fashion.  The chest was closed  with 1  paracostal, #1 Vicryl in the muscle layer, 2-0 Vicryl in the  subcutaneous tissue.  The paracostal was drilled through the seventh rib.  The patient tolerated the procedure well, was returned to the recovery room  in stable condition.           ______________________________  Ines Bloomer, M.D.     DPB/MEDQ  D:  04/10/2006  T:  04/10/2006  Job:  [id number]   cc:   Loreta Ave, M.D.

## 2011-01-18 NOTE — Discharge Summary (Signed)
[name], [name] NO.:  [id number]   MEDICAL RECORD NO.:  [id number]          PATIENT TYPE:  INP   LOCATION:  3311                         FACILITY:  [facility]   PHYSICIAN:  Ines Bloomer, M.D. DATE OF BIRTH:  [birth year]   DATE OF ADMISSION:  04/10/2006  DATE OF DISCHARGE:                                 DISCHARGE SUMMARY   PRIMARY DIAGNOSIS:  Right posterior chest wall mass, schwannoma.   SECONDARY DIAGNOSES:  1. Hyperlipidemia.  2. Status post right total knee replacement.   ALLERGIES:  NO KNOWN DRUG ALLERGIES.   OPERATIONS/PROCEDURES:  Right video-assisted thoracoscopic surgery with mini-  thoracotomy and resection of schwannoma.   HISTORY AND PHYSICAL AND HOSPITAL COURSE:  Patient is a 70 year old female  who has been in good health otherwise, except for arthritis.  She is status  post total right knee replacement.  At the time of her workup, prior to  undergoing right total knee replacement, she was found to have a 3.3 x 2.9 x  2.3 mass in the T4 area on CT scan.  This was first seen on chest x-ray.  There is no spinal extension of the mass.  A repeat CT scan done showed no  enlargement since original CT scan.  The patient was seen and evaluated by  Dr. Edwyna Shell.  Dr. Edwyna Shell discussed with the patient resection of this mass.  He discussed the risks and benefits with the patient.  The patient  acknowledged her understanding and agreed to proceed.  Surgery was scheduled  for April 10, 2006.  For further details of history and physical exam please  see dictated history and physical.   Patient was taken to the operating room on April 10, 2006 where she under  went right video-assisted thoracoscopic surgery of mini-thoracotomy  resection of schwannoma.  Patient tolerated this procedure well and was  transferred up to the intensive care unit in stable condition.  Patient's  postoperative course is pretty much unremarkable.  She seemed to be alert  and  oriented x3.  Neuro intact following surgery.  She was hemodynamically  stable.  Postoperative chest x-rays were stable.  No air leak noted.  Patient was placed on water postop day #1.  Chest tube was removed postop  day #2.  Follow up chest x-ray post removal of chest tube showed no  pneumothorax noted.  [name] was out of bed ambulating well.  Vital signs  were monitored and seemed to be stable.  She was able to be weaned off  oxygen, saturating greater than 90% on room air prior to discharge home.  She seemed to be afebrile during her whole postoperative course.  Patient  remained hemodynamically stable with hemoglobin and hematocrit of 11.3 and  33.2.  Patient's incisions were clean, dry, and intact and healing well.  She was tolerating a regular diet without any nausea, vomiting noted.   Patient is tentatively ready for discharge home Sunday, postop day #3,  April 13, 2006.  The followup appointment has been arranged with Dr. Edwyna Shell  for April 22, 2006 at 9:50 a.m.  Patient is to obtain a PA and lateral  chest x-ray 1 hour prior to this appointment.   ACTIVITY:  The patient is instructed no driving until released to do so, no  heavy lifting over 10 pounds.  Told to ambulate 3 or 4 times per day  progressive tolerating, continue breathing exercises.   INCISIONAL CARE:  Patient was told to was the incision using soap and water.  She is to contact the office if she develops any drainage or opening from  any of her incision sites, or temperature greater than 101 degrees  Fahrenheit.   DIET:  The patient is educated on diet to be low fat, low sat.   MEDICATIONS:  1. Tylox 1 to 2 tabs q.4 to 6 hours p.r.n. pain.  2. Zocor 40 mg q.h.s.  3. Ambien 10 mg q.h.s.  4. Omega-3 1000 mg daily.  5. Potassium 90 mEq daily.  6. Biotin 1500 mEq daily.  7. Glucosamine 3 tablets daily.  8. Ginkgo biloba 1 tablet daily.  9. Grape seed 3 tablets daily.  10.Calcium 600 mg daily.   11.__________ oil. 1200 mg daily.  12.Fiber laxative 2 tablets daily.  13.Multivitamin daily.      Theda Belfast, PA    ______________________________  Ines Bloomer, M.D.    KMD/MEDQ  D:  04/11/2006  T:  04/11/2006  Job:  [id number]

## 2011-01-24 ENCOUNTER — Ambulatory Visit
Admission: RE | Admit: 2011-01-24 | Discharge: 2011-01-24 | Disposition: A | Discharge status: Home / Self Care | Payer: BC Managed Care – PPO | Source: Ambulatory Visit | Attending: Otolaryngology | Admitting: Otolaryngology

## 2011-01-24 MED ORDER — GADOBENATE DIMEGLUMINE 529 MG/ML IV SOLN
20.0000 mL | Freq: Once | INTRAVENOUS | Status: AC | PRN
  Administered 2011-01-24: 20 mL via INTRAVENOUS

## 2011-02-04 ENCOUNTER — Other Ambulatory Visit: Payer: Self-pay | Admitting: Otolaryngology

## 2011-02-04 DIAGNOSIS — H9209 Otalgia, unspecified ear: Secondary | ICD-10-CM

## 2011-02-06 ENCOUNTER — Ambulatory Visit
Admission: RE | Admit: 2011-02-06 | Discharge: 2011-02-06 | Disposition: A | Discharge status: Home / Self Care | Payer: BC Managed Care – PPO | Source: Ambulatory Visit | Attending: Otolaryngology | Admitting: Otolaryngology

## 2011-02-06 DIAGNOSIS — H9209 Otalgia, unspecified ear: Secondary | ICD-10-CM

## 2011-02-13 ENCOUNTER — Other Ambulatory Visit (HOSPITAL_COMMUNITY): Payer: Self-pay | Admitting: Otolaryngology

## 2011-02-13 DIAGNOSIS — Q211 Atrial septal defect: Secondary | ICD-10-CM

## 2011-02-14 ENCOUNTER — Ambulatory Visit (HOSPITAL_COMMUNITY): Payer: BC Managed Care – PPO | Attending: Cardiology | Admitting: Radiology

## 2011-02-14 DIAGNOSIS — H9190 Unspecified hearing loss, unspecified ear: Secondary | ICD-10-CM

## 2011-02-14 DIAGNOSIS — I059 Rheumatic mitral valve disease, unspecified: Secondary | ICD-10-CM | POA: Insufficient documentation

## 2011-02-14 DIAGNOSIS — Q250 Patent ductus arteriosus: Secondary | ICD-10-CM | POA: Insufficient documentation

## 2011-02-14 DIAGNOSIS — Q211 Atrial septal defect: Secondary | ICD-10-CM

## 2011-02-15 ENCOUNTER — Encounter (HOSPITAL_COMMUNITY): Payer: Self-pay | Admitting: Otolaryngology

## 2011-02-19 ENCOUNTER — Other Ambulatory Visit: Payer: Self-pay

## 2011-04-09 ENCOUNTER — Ambulatory Visit
Admission: RE | Admit: 2011-04-09 | Discharge: 2011-04-09 | Disposition: A | Discharge status: Home / Self Care | Payer: BC Managed Care – PPO | Source: Ambulatory Visit | Attending: Family Medicine | Admitting: Family Medicine

## 2011-04-09 ENCOUNTER — Other Ambulatory Visit: Payer: Self-pay | Admitting: Family Medicine

## 2011-04-09 DIAGNOSIS — Z01818 Encounter for other preprocedural examination: Secondary | ICD-10-CM

## 2014-01-14 ENCOUNTER — Other Ambulatory Visit: Payer: Self-pay | Admitting: Gastroenterology

## 2014-08-08 ENCOUNTER — Other Ambulatory Visit: Payer: Self-pay | Admitting: Family Medicine

## 2014-08-08 DIAGNOSIS — R609 Edema, unspecified: Secondary | ICD-10-CM

## 2014-08-09 ENCOUNTER — Ambulatory Visit
Admission: RE | Admit: 2014-08-09 | Discharge: 2014-08-09 | Disposition: A | Discharge status: Home / Self Care | Payer: BC Managed Care – PPO | Source: Ambulatory Visit | Attending: Family Medicine | Admitting: Family Medicine

## 2014-08-09 DIAGNOSIS — R609 Edema, unspecified: Secondary | ICD-10-CM

## 2014-09-02 HISTORY — PX: TOTAL SHOULDER REPLACEMENT: SUR1217

## 2014-09-20 DIAGNOSIS — E785 Hyperlipidemia, unspecified: Secondary | ICD-10-CM | POA: Diagnosis present

## 2014-09-20 DIAGNOSIS — Z8614 Personal history of Methicillin resistant Staphylococcus aureus infection: Secondary | ICD-10-CM | POA: Diagnosis not present

## 2014-09-20 DIAGNOSIS — M19011 Primary osteoarthritis, right shoulder: Secondary | ICD-10-CM | POA: Diagnosis present

## 2014-09-20 DIAGNOSIS — D649 Anemia, unspecified: Secondary | ICD-10-CM | POA: Diagnosis present

## 2014-09-20 DIAGNOSIS — E780 Pure hypercholesterolemia: Secondary | ICD-10-CM | POA: Diagnosis present

## 2014-09-20 DIAGNOSIS — Z96611 Presence of right artificial shoulder joint: Secondary | ICD-10-CM | POA: Diagnosis not present

## 2014-09-20 DIAGNOSIS — Z885 Allergy status to narcotic agent status: Secondary | ICD-10-CM | POA: Diagnosis not present

## 2014-09-20 DIAGNOSIS — Z471 Aftercare following joint replacement surgery: Secondary | ICD-10-CM | POA: Diagnosis not present

## 2016-07-03 ENCOUNTER — Encounter
Admission: RE | Admit: 2016-07-03 | Discharge: 2016-07-03 | Disposition: A | Discharge status: Home / Self Care | Payer: BLUE CROSS/BLUE SHIELD | Source: Ambulatory Visit | Attending: Orthopedic Surgery | Admitting: Orthopedic Surgery

## 2016-07-03 ENCOUNTER — Encounter: Payer: Self-pay | Admitting: *Deleted

## 2016-07-03 DIAGNOSIS — Z0183 Encounter for blood typing: Secondary | ICD-10-CM | POA: Diagnosis not present

## 2016-07-03 DIAGNOSIS — M1610 Unilateral primary osteoarthritis, unspecified hip: Secondary | ICD-10-CM | POA: Insufficient documentation

## 2016-07-03 DIAGNOSIS — Z01818 Encounter for other preprocedural examination: Secondary | ICD-10-CM | POA: Insufficient documentation

## 2016-07-03 DIAGNOSIS — Z01812 Encounter for preprocedural laboratory examination: Secondary | ICD-10-CM | POA: Insufficient documentation

## 2016-07-03 HISTORY — DX: Unspecified osteoarthritis, unspecified site: M19.90

## 2016-07-03 HISTORY — DX: Malignant (primary) neoplasm, unspecified: C80.1

## 2016-07-03 HISTORY — DX: Unspecified hearing loss, unspecified ear: H91.90

## 2016-07-03 LAB — BASIC METABOLIC PANEL
Anion gap: 6 (ref 5–15)
BUN: 17 mg/dL (ref 6–20)
CALCIUM: 9.4 mg/dL (ref 8.9–10.3)
CO2: 31 mmol/L (ref 22–32)
CREATININE: 0.76 mg/dL (ref 0.44–1.00)
Chloride: 100 mmol/L — ABNORMAL LOW (ref 101–111)
GFR calc non Af Amer: >60 mL/min (ref >60)
GLUCOSE: 81 mg/dL (ref 65–99)
Potassium: 4.4 mmol/L (ref 3.5–5.1)
Sodium: 137 mmol/L (ref 135–145)

## 2016-07-03 LAB — URINALYSIS COMPLETE WITH MICROSCOPIC (ARMC ONLY)
BACTERIA UA: NONE SEEN
BILIRUBIN URINE: NEGATIVE
Glucose, UA: NEGATIVE mg/dL
HGB URINE DIPSTICK: NEGATIVE
KETONES UR: NEGATIVE mg/dL
NITRITE: NEGATIVE
PH: 7 (ref 5.0–8.0)
Protein, ur: NEGATIVE mg/dL
SPECIFIC GRAVITY, URINE: 1.016 (ref 1.005–1.030)

## 2016-07-03 LAB — CBC
HEMATOCRIT: 37.4 % (ref 35.0–47.0)
Hemoglobin: 12.8 g/dL (ref 12.0–16.0)
MCH: 32 pg (ref 26.0–34.0)
MCHC: 34.2 g/dL (ref 32.0–36.0)
MCV: 93.8 fL (ref 80.0–100.0)
Platelets: 220 10*3/uL (ref 150–440)
RBC: 3.98 MIL/uL (ref 3.80–5.20)
RDW: 14.1 % (ref 11.5–14.5)
WBC: 6.1 10*3/uL (ref 3.6–11.0)

## 2016-07-03 LAB — PROTIME-INR
INR: 0.95
Prothrombin Time: 12.7 seconds (ref 11.4–15.2)

## 2016-07-03 LAB — TYPE AND SCREEN
ABO/RH(D): A NEG
Antibody Screen: NEGATIVE

## 2016-07-03 LAB — APTT: aPTT: 33 seconds (ref 24–36)

## 2016-07-03 LAB — SURGICAL PCR SCREEN
MRSA, PCR: NEGATIVE
STAPHYLOCOCCUS AUREUS: NEGATIVE

## 2016-07-03 LAB — SEDIMENTATION RATE: SED RATE: 39 mm/h — AB (ref 0–30)

## 2016-07-03 NOTE — Patient Instructions (Signed)
  Your procedure is scheduled on: July 16, 2016 (Tuesday) Report to [facility]nd [facility] To find out your arrival time please call [phone] between 1PM - 3PM on July 15, 2016 (Monday)  Remember: Instructions that are not followed completely may result in serious medical risk, up to and including death, or upon the discretion of your surgeon and anesthesiologist your surgery may need to be rescheduled.    _x___ 1. Do not eat food or drink liquids after midnight. No gum chewing or hard candies.     __x__ 2. No Alcohol for 24 hours before or after surgery.   __x__3. No Smoking for 24 prior to surgery.   ____  4. Bring all medications with you on the day of surgery if instructed.    __x__ 5. Notify your doctor if there is any change in your medical condition     (cold, fever, infections).     Do not wear jewelry, make-up, hairpins, clips or nail polish.  Do not wear lotions, powders, or perfumes. You may wear deodorant.  Do not shave 48 hours prior to surgery. Men may shave face and neck.  Do not bring valuables to the hospital.    [facility] is not responsible for any belongings or valuables.               Contacts, dentures or bridgework may not be worn into surgery.  Leave your suitcase in the car. After surgery it may be brought to your room.  For patients admitted to the hospital, discharge time is determined by your treatment team.   Patients discharged the day of surgery will not be allowed to drive home.    Please read over the following fact sheets that you were given:   [facility] Preparing for Surgery and or MRSA Information   __ Take these medicines the morning of surgery with A SIP OF WATER:    1.   2.  3.  4.  5.  6.  ____Fleets enema or Magnesium Citrate as directed.   _x___ Use CHG Soap or sage wipes as directed on instruction sheet   ____ Use inhalers on the day of surgery and bring to hospital day of surgery  ____ Stop  metformin 2 days prior to surgery    ____ Take 1/2 of usual insulin dose the night before surgery and none on the morning of           surgery.   _x___ Stop aspirin or coumadin, or plavix (Stop Aspirin one week prior to surgery)  x__ Stop Anti-inflammatories such as Advil, Aleve, Ibuprofen, Motrin, Naproxen,          Naprosyn, Goodies powders or aspirin products. Ok to take Tylenol.   __x_ Stop supplements until after surgery. (STOP ALL SUPPLEMENTS NOW !!! )   ____ Bring C-Pap to the hospital.

## 2016-07-04 LAB — URINE CULTURE: CULTURE: NO GROWTH

## 2016-07-04 MED ORDER — FAMOTIDINE 20 MG PO TABS
ORAL_TABLET | ORAL | Status: AC
  Filled 2016-07-04: qty 1

## 2016-07-16 ENCOUNTER — Inpatient Hospital Stay: Payer: BLUE CROSS/BLUE SHIELD

## 2016-07-16 ENCOUNTER — Inpatient Hospital Stay: Payer: BLUE CROSS/BLUE SHIELD | Admitting: Certified Registered"

## 2016-07-16 ENCOUNTER — Encounter: Payer: Self-pay | Admitting: *Deleted

## 2016-07-16 ENCOUNTER — Encounter
Admission: RE | Disposition: A | Discharge status: Skilled Nursing Facility | Payer: Self-pay | Source: Ambulatory Visit | Attending: Orthopedic Surgery

## 2016-07-16 ENCOUNTER — Inpatient Hospital Stay
Admission: RE | Admit: 2016-07-16 | Discharge: 2016-07-19 | DRG: 470 | Disposition: A | Discharge status: Skilled Nursing Facility | Payer: BLUE CROSS/BLUE SHIELD | Source: Ambulatory Visit | Attending: Orthopedic Surgery | Admitting: Orthopedic Surgery

## 2016-07-16 DIAGNOSIS — K219 Gastro-esophageal reflux disease without esophagitis: Secondary | ICD-10-CM | POA: Diagnosis present

## 2016-07-16 DIAGNOSIS — Z79899 Other long term (current) drug therapy: Secondary | ICD-10-CM

## 2016-07-16 DIAGNOSIS — Z85828 Personal history of other malignant neoplasm of skin: Secondary | ICD-10-CM | POA: Diagnosis not present

## 2016-07-16 DIAGNOSIS — Z419 Encounter for procedure for purposes other than remedying health state, unspecified: Secondary | ICD-10-CM

## 2016-07-16 DIAGNOSIS — D62 Acute posthemorrhagic anemia: Secondary | ICD-10-CM | POA: Diagnosis not present

## 2016-07-16 DIAGNOSIS — H409 Unspecified glaucoma: Secondary | ICD-10-CM | POA: Diagnosis present

## 2016-07-16 DIAGNOSIS — Z7982 Long term (current) use of aspirin: Secondary | ICD-10-CM | POA: Diagnosis not present

## 2016-07-16 DIAGNOSIS — M25552 Pain in left hip: Secondary | ICD-10-CM

## 2016-07-16 DIAGNOSIS — M6281 Muscle weakness (generalized): Secondary | ICD-10-CM

## 2016-07-16 DIAGNOSIS — G8918 Other acute postprocedural pain: Secondary | ICD-10-CM

## 2016-07-16 DIAGNOSIS — H9191 Unspecified hearing loss, right ear: Secondary | ICD-10-CM | POA: Diagnosis present

## 2016-07-16 DIAGNOSIS — M1612 Unilateral primary osteoarthritis, left hip: Secondary | ICD-10-CM | POA: Diagnosis present

## 2016-07-16 DIAGNOSIS — Z885 Allergy status to narcotic agent status: Secondary | ICD-10-CM

## 2016-07-16 DIAGNOSIS — R262 Difficulty in walking, not elsewhere classified: Secondary | ICD-10-CM

## 2016-07-16 HISTORY — PX: TOTAL HIP ARTHROPLASTY: SHX124

## 2016-07-16 HISTORY — DX: Gastro-esophageal reflux disease without esophagitis: K21.9

## 2016-07-16 LAB — ABO/RH: ABO/RH(D): A NEG

## 2016-07-16 LAB — CBC
HCT: 37.7 % (ref 35.0–47.0)
HEMOGLOBIN: 12.3 g/dL (ref 12.0–16.0)
MCH: 31 pg (ref 26.0–34.0)
MCHC: 32.6 g/dL (ref 32.0–36.0)
MCV: 95 fL (ref 80.0–100.0)
Platelets: 219 10*3/uL (ref 150–440)
RBC: 3.96 MIL/uL (ref 3.80–5.20)
RDW: 13.8 % (ref 11.5–14.5)
WBC: 11.6 10*3/uL — ABNORMAL HIGH (ref 3.6–11.0)

## 2016-07-16 LAB — CREATININE, SERUM
CREATININE: 0.56 mg/dL (ref 0.44–1.00)
GFR calc Af Amer: >60 mL/min (ref >60)

## 2016-07-16 SURGERY — ARTHROPLASTY, HIP, TOTAL, ANTERIOR APPROACH
Anesthesia: Spinal | Laterality: Left | Wound class: Clean

## 2016-07-16 MED ORDER — KETAMINE HCL 50 MG/ML IJ SOLN
INTRAMUSCULAR | Status: DC | PRN
  Administered 2016-07-16: 25 mg via INTRAVENOUS

## 2016-07-16 MED ORDER — FENTANYL CITRATE (PF) 100 MCG/2ML IJ SOLN
INTRAMUSCULAR | Status: DC | PRN
  Administered 2016-07-16: 50 ug via INTRAVENOUS

## 2016-07-16 MED ORDER — ENOXAPARIN SODIUM 40 MG/0.4ML ~~LOC~~ SOLN
40.0000 mg | SUBCUTANEOUS | Status: DC
  Administered 2016-07-17 – 2016-07-19 (×3): 40 mg via SUBCUTANEOUS
  Filled 2016-07-16 (×3): qty 0.4

## 2016-07-16 MED ORDER — TRANEXAMIC ACID 1000 MG/10ML IV SOLN
1000.0000 mg | INTRAVENOUS | Status: AC
  Administered 2016-07-16: 1000 mg via INTRAVENOUS
  Filled 2016-07-16: qty 10

## 2016-07-16 MED ORDER — MENTHOL 3 MG MT LOZG
1.0000 | LOZENGE | OROMUCOSAL | Status: DC | PRN
  Filled 2016-07-16: qty 9

## 2016-07-16 MED ORDER — SODIUM CHLORIDE 0.9 % IV SOLN
INTRAVENOUS | Status: DC
  Administered 2016-07-16 – 2016-07-17 (×2): via INTRAVENOUS

## 2016-07-16 MED ORDER — CEFAZOLIN SODIUM-DEXTROSE 2-4 GM/100ML-% IV SOLN
2.0000 g | Freq: Four times a day (QID) | INTRAVENOUS | Status: AC
  Administered 2016-07-16 – 2016-07-17 (×3): 2 g via INTRAVENOUS
  Filled 2016-07-16 (×3): qty 100

## 2016-07-16 MED ORDER — METOCLOPRAMIDE HCL 10 MG PO TABS: 5.0000 mg | ORAL_TABLET | Freq: Three times a day (TID) | ORAL | Status: DC | PRN

## 2016-07-16 MED ORDER — METHOCARBAMOL 500 MG PO TABS
500.0000 mg | ORAL_TABLET | Freq: Four times a day (QID) | ORAL | Status: DC | PRN
  Administered 2016-07-16 – 2016-07-17 (×2): 500 mg via ORAL
  Filled 2016-07-16 (×2): qty 1

## 2016-07-16 MED ORDER — CEFAZOLIN SODIUM-DEXTROSE 2-4 GM/100ML-% IV SOLN
2.0000 g | Freq: Once | INTRAVENOUS | Status: AC
  Administered 2016-07-16: 2 g via INTRAVENOUS

## 2016-07-16 MED ORDER — BUPIVACAINE-EPINEPHRINE 0.25% -1:200000 IJ SOLN
INTRAMUSCULAR | Status: DC | PRN
  Administered 2016-07-16: 30 mL

## 2016-07-16 MED ORDER — METOCLOPRAMIDE HCL 5 MG/ML IJ SOLN: 5.0000 mg | Freq: Three times a day (TID) | INTRAMUSCULAR | Status: DC | PRN

## 2016-07-16 MED ORDER — ACETAMINOPHEN 325 MG PO TABS: 650.0000 mg | ORAL_TABLET | Freq: Four times a day (QID) | ORAL | Status: DC | PRN

## 2016-07-16 MED ORDER — PHENOL 1.4 % MT LIQD
1.0000 | OROMUCOSAL | Status: DC | PRN
Start: 2016-07-16 — End: 2016-07-19
  Filled 2016-07-16: qty 177

## 2016-07-16 MED ORDER — MAGNESIUM HYDROXIDE 400 MG/5ML PO SUSP
30.0000 mL | Freq: Every day | ORAL | Status: DC | PRN
  Administered 2016-07-18 – 2016-07-19 (×2): 30 mL via ORAL
  Filled 2016-07-16 (×2): qty 30

## 2016-07-16 MED ORDER — GLYCOPYRROLATE 0.2 MG/ML IJ SOLN
INTRAMUSCULAR | Status: DC | PRN
  Administered 2016-07-16: 0.2 mg via INTRAVENOUS

## 2016-07-16 MED ORDER — BIOTIN 5 MG PO CAPS
5.0000 mg | ORAL_CAPSULE | Freq: Every day | ORAL | Status: DC
Start: 2016-07-16 — End: 2016-07-16
  Filled 2016-07-16: qty 1

## 2016-07-16 MED ORDER — BUPIVACAINE-EPINEPHRINE (PF) 0.25% -1:200000 IJ SOLN
INTRAMUSCULAR | Status: AC
  Filled 2016-07-16: qty 30

## 2016-07-16 MED ORDER — ASPIRIN EC 81 MG PO TBEC
81.0000 mg | DELAYED_RELEASE_TABLET | Freq: Every day | ORAL | Status: DC
  Administered 2016-07-16 – 2016-07-19 (×4): 81 mg via ORAL
  Filled 2016-07-16 (×4): qty 1

## 2016-07-16 MED ORDER — FAMOTIDINE 20 MG PO TABS
ORAL_TABLET | ORAL | Status: AC
  Administered 2016-07-16: 20 mg via ORAL
  Filled 2016-07-16: qty 1

## 2016-07-16 MED ORDER — DIPHENHYDRAMINE HCL 12.5 MG/5ML PO ELIX: 12.5000 mg | ORAL_SOLUTION | ORAL | Status: DC | PRN

## 2016-07-16 MED ORDER — VITAMIN C 500 MG PO TABS
1000.0000 mg | ORAL_TABLET | Freq: Every day | ORAL | Status: DC
  Administered 2016-07-17 – 2016-07-19 (×3): 1000 mg via ORAL
  Filled 2016-07-16 (×3): qty 2

## 2016-07-16 MED ORDER — MORPHINE SULFATE (PF) 2 MG/ML IV SOLN
2.0000 mg | INTRAVENOUS | Status: DC | PRN
  Administered 2016-07-16: 2 mg via INTRAVENOUS
  Filled 2016-07-16: qty 1

## 2016-07-16 MED ORDER — BISACODYL 10 MG RE SUPP
10.0000 mg | Freq: Every day | RECTAL | Status: DC | PRN
  Administered 2016-07-19: 10 mg via RECTAL
  Filled 2016-07-16 (×2): qty 1

## 2016-07-16 MED ORDER — MAGNESIUM CITRATE PO SOLN
1.0000 | Freq: Once | ORAL | Status: AC | PRN
Start: 2016-07-16 — End: 2016-07-19
  Administered 2016-07-19: 1 via ORAL
  Filled 2016-07-16 (×2): qty 296

## 2016-07-16 MED ORDER — DOCUSATE SODIUM 100 MG PO CAPS
100.0000 mg | ORAL_CAPSULE | Freq: Every day | ORAL | Status: DC
  Administered 2016-07-16 – 2016-07-19 (×4): 100 mg via ORAL
  Filled 2016-07-16 (×4): qty 1

## 2016-07-16 MED ORDER — LIDOCAINE HCL (PF) 2 % IJ SOLN
INTRAMUSCULAR | Status: DC | PRN
  Administered 2016-07-16: 50 mg

## 2016-07-16 MED ORDER — ONDANSETRON HCL 4 MG PO TABS: 4.0000 mg | ORAL_TABLET | Freq: Four times a day (QID) | ORAL | Status: DC | PRN

## 2016-07-16 MED ORDER — PROPOFOL 500 MG/50ML IV EMUL
INTRAVENOUS | Status: DC | PRN
  Administered 2016-07-16: 25 ug/kg/min via INTRAVENOUS

## 2016-07-16 MED ORDER — HYDROCODONE-ACETAMINOPHEN 7.5-325 MG PO TABS
1.0000 | ORAL_TABLET | ORAL | Status: DC | PRN
  Administered 2016-07-16 – 2016-07-17 (×2): 1 via ORAL
  Administered 2016-07-17 (×4): 2 via ORAL
  Administered 2016-07-18 – 2016-07-19 (×3): 1 via ORAL
  Filled 2016-07-16: qty 1
  Filled 2016-07-16: qty 2
  Filled 2016-07-16: qty 1
  Filled 2016-07-16: qty 2
  Filled 2016-07-16 (×2): qty 1
  Filled 2016-07-16 (×2): qty 2
  Filled 2016-07-16: qty 1

## 2016-07-16 MED ORDER — NEOMYCIN-POLYMYXIN B GU 40-200000 IR SOLN
Status: DC | PRN
  Administered 2016-07-16: 4 mL

## 2016-07-16 MED ORDER — MAGNESIUM GLUCONATE 500 MG PO TABS
500.0000 mg | ORAL_TABLET | Freq: Every day | ORAL | Status: DC
  Administered 2016-07-16 – 2016-07-19 (×4): 500 mg via ORAL
  Filled 2016-07-16 (×4): qty 1

## 2016-07-16 MED ORDER — ALUM & MAG HYDROXIDE-SIMETH 200-200-20 MG/5ML PO SUSP: 30.0000 mL | ORAL | Status: DC | PRN

## 2016-07-16 MED ORDER — ZOLPIDEM TARTRATE 5 MG PO TABS: 5.0000 mg | ORAL_TABLET | Freq: Every evening | ORAL | Status: DC | PRN

## 2016-07-16 MED ORDER — COCONUT OIL 1000 MG PO CAPS: 1.0000 | ORAL_CAPSULE | Freq: Every day | ORAL | Status: DC

## 2016-07-16 MED ORDER — VITAMIN D3 250 MCG (10000 UT) PO TABS
1.0000 | ORAL_TABLET | Freq: Every day | ORAL | Status: DC
  Filled 2016-07-16: qty 1

## 2016-07-16 MED ORDER — FAMOTIDINE 20 MG PO TABS
20.0000 mg | ORAL_TABLET | Freq: Once | ORAL | Status: AC
  Administered 2016-07-16: 20 mg via ORAL

## 2016-07-16 MED ORDER — LACTATED RINGERS IV SOLN
INTRAVENOUS | Status: DC
  Administered 2016-07-16: 50 mL/h via INTRAVENOUS

## 2016-07-16 MED ORDER — CEFAZOLIN SODIUM-DEXTROSE 2-4 GM/100ML-% IV SOLN
INTRAVENOUS | Status: AC
  Filled 2016-07-16: qty 100

## 2016-07-16 MED ORDER — FENTANYL CITRATE (PF) 100 MCG/2ML IJ SOLN: 25.0000 ug | INTRAMUSCULAR | Status: DC | PRN

## 2016-07-16 MED ORDER — METHOCARBAMOL 1000 MG/10ML IJ SOLN
500.0000 mg | Freq: Four times a day (QID) | INTRAVENOUS | Status: DC | PRN
  Filled 2016-07-16: qty 5

## 2016-07-16 MED ORDER — CALCIUM POLYCARBOPHIL 625 MG PO TABS
1000.0000 mg | ORAL_TABLET | Freq: Every day | ORAL | Status: DC
  Administered 2016-07-16 – 2016-07-19 (×4): 937.5 mg via ORAL
  Filled 2016-07-16: qty 1.5
  Filled 2016-07-16 (×3): qty 2
  Filled 2016-07-16: qty 1.5

## 2016-07-16 MED ORDER — ACETAMINOPHEN 650 MG RE SUPP: 650.0000 mg | Freq: Four times a day (QID) | RECTAL | Status: DC | PRN

## 2016-07-16 MED ORDER — LYSINE HCL 1000 MG PO TABS
1000.0000 mg | ORAL_TABLET | Freq: Every day | ORAL | Status: DC
  Filled 2016-07-16: qty 1

## 2016-07-16 MED ORDER — ONDANSETRON HCL 4 MG/2ML IJ SOLN
4.0000 mg | Freq: Four times a day (QID) | INTRAMUSCULAR | Status: DC | PRN
  Administered 2016-07-16: 4 mg via INTRAVENOUS
  Filled 2016-07-16: qty 2

## 2016-07-16 MED ORDER — MIDAZOLAM HCL 5 MG/5ML IJ SOLN
INTRAMUSCULAR | Status: DC | PRN
  Administered 2016-07-16 (×2): 1 mg via INTRAVENOUS

## 2016-07-16 MED ORDER — POTASSIUM 99 MG PO TABS: 1.0000 | ORAL_TABLET | Freq: Every day | ORAL | Status: DC

## 2016-07-16 MED ORDER — NEOMYCIN-POLYMYXIN B GU 40-200000 IR SOLN
Status: AC
  Filled 2016-07-16: qty 4

## 2016-07-16 MED ORDER — ADULT MULTIVITAMIN W/MINERALS CH
1.0000 | ORAL_TABLET | Freq: Every day | ORAL | Status: DC
  Administered 2016-07-17 – 2016-07-19 (×3): 1 via ORAL
  Filled 2016-07-16 (×4): qty 1

## 2016-07-16 MED ORDER — BUPIVACAINE HCL (PF) 0.5 % IJ SOLN
INTRAMUSCULAR | Status: DC | PRN
  Administered 2016-07-16: 3 mL via INTRATHECAL

## 2016-07-16 MED ORDER — VITAMIN B COMPLEX PO TABS: 1.0000 | ORAL_TABLET | Freq: Every day | ORAL | Status: DC

## 2016-07-16 SURGICAL SUPPLY — 48 items
BLADE SAW SAG 18.5X105 (BLADE) ×3 IMPLANT
BNDG COHESIVE 6X5 TAN STRL LF (GAUZE/BANDAGES/DRESSINGS) ×6 IMPLANT
CANISTER SUCT 1200ML W/VALVE (MISCELLANEOUS) ×3 IMPLANT
CAPT HIP TOTAL 3 ×2 IMPLANT
CATH FOL LEG HOLDER (MISCELLANEOUS) ×3 IMPLANT
CATH TRAY METER 16FR LF (MISCELLANEOUS) ×3 IMPLANT
CHLORAPREP W/TINT 26ML (MISCELLANEOUS) ×3 IMPLANT
DRAPE C-ARM XRAY 36X54 (DRAPES) ×3 IMPLANT
DRAPE INCISE IOBAN 66X60 STRL (DRAPES) IMPLANT
DRAPE POUCH INSTRU U-SHP 10X18 (DRAPES) ×3 IMPLANT
DRAPE SHEET LG 3/4 BI-LAMINATE (DRAPES) ×9 IMPLANT
DRAPE STERI IOBAN 125X83 (DRAPES) ×3 IMPLANT
DRAPE TABLE BACK 80X90 (DRAPES) ×3 IMPLANT
DRSG OPSITE POSTOP 4X8 (GAUZE/BANDAGES/DRESSINGS) ×4 IMPLANT
ELECT BLADE 6.5 EXT (BLADE) ×3 IMPLANT
GAUZE SPONGE 4X4 12PLY STRL (GAUZE/BANDAGES/DRESSINGS) ×3 IMPLANT
GLOVE BIOGEL PI IND STRL 9 (GLOVE) ×1 IMPLANT
GLOVE BIOGEL PI INDICATOR 9 (GLOVE) ×2
GLOVE SURG SYN 9.0  PF PI (GLOVE) ×4
GLOVE SURG SYN 9.0 PF PI (GLOVE) ×2 IMPLANT
GOWN SRG 2XL LVL 4 RGLN SLV (GOWNS) ×1 IMPLANT
GOWN STRL NON-REIN 2XL LVL4 (GOWNS) ×3
GOWN STRL REUS W/ TWL LRG LVL3 (GOWN DISPOSABLE) ×1 IMPLANT
GOWN STRL REUS W/TWL LRG LVL3 (GOWN DISPOSABLE) ×3
HEMOVAC 400CC 10FR (MISCELLANEOUS) ×1 IMPLANT
HOOD PEEL AWAY FLYTE STAYCOOL (MISCELLANEOUS) ×3 IMPLANT
KIT PREVENA INCISION MGT 13 (CANNISTER) ×2 IMPLANT
MAT BLUE FLOOR 46X72 FLO (MISCELLANEOUS) ×3 IMPLANT
NDL SAFETY 18GX1.5 (NEEDLE) ×3 IMPLANT
NDL SPNL 18GX3.5 QUINCKE PK (NEEDLE) ×1 IMPLANT
NEEDLE SPNL 18GX3.5 QUINCKE PK (NEEDLE) ×3 IMPLANT
NS IRRIG 1000ML POUR BTL (IV SOLUTION) ×3 IMPLANT
PACK HIP COMPR (MISCELLANEOUS) ×3 IMPLANT
SOL PREP PVP 2OZ (MISCELLANEOUS) ×3
SOLUTION PREP PVP 2OZ (MISCELLANEOUS) ×1 IMPLANT
STAPLER SKIN PROX 35W (STAPLE) ×3 IMPLANT
STRAP SAFETY BODY (MISCELLANEOUS) ×3 IMPLANT
SUT DVC 2 QUILL PDO  T11 36X36 (SUTURE) ×2
SUT DVC 2 QUILL PDO T11 36X36 (SUTURE) ×1 IMPLANT
SUT DVC QUILL MONODERM 30X30 (SUTURE) ×3 IMPLANT
SUT SILK 0 (SUTURE) ×3
SUT SILK 0 30XBRD TIE 6 (SUTURE) ×1 IMPLANT
SUT VIC AB 1 CT1 36 (SUTURE) ×3 IMPLANT
SYR 20CC LL (SYRINGE) ×3 IMPLANT
SYR 30ML LL (SYRINGE) ×3 IMPLANT
TAPE MICROFOAM 4IN (TAPE) ×1 IMPLANT
TOWEL OR 17X26 4PK STRL BLUE (TOWEL DISPOSABLE) ×3 IMPLANT
TUBE KAMVAC SUCTION (TUBING) ×1 IMPLANT

## 2016-07-16 NOTE — H&P (Signed)
Reviewed paper H+P, will be scanned into chart. No changes noted.  

## 2016-07-16 NOTE — Transfer of Care (Signed)
Immediate Anesthesia Transfer of Care Note  Patient: [name]  Procedure(s) Performed: Procedure(s): TOTAL HIP ARTHROPLASTY ANTERIOR APPROACH (Left)  Patient Location: PACU  Anesthesia Type:Spinal  Level of Consciousness: awake  Airway & Oxygen Therapy: Patient Spontanous Breathing and Patient connected to face mask oxygen  Post-op Assessment: Report given to RN and Post -op Vital signs reviewed and stable  Post vital signs: Reviewed  Last Vitals:  Vitals:   07/16/16 1412 07/16/16 1647  BP: 123/73 (!) 95/50  Pulse: 73 (!) 55  Resp: 16 15  Temp: 36.5 C 36.7 C    Last Pain:  Vitals:   07/16/16 1412  TempSrc: Oral  PainSc: 2       Patients Stated Pain Goal: 0 ([id number])  Complications: No apparent anesthesia complications

## 2016-07-16 NOTE — Progress Notes (Signed)
PHARMACIST - PHYSICIAN ORDER COMMUNICATION  CONCERNING: P&T Medication Policy on Herbal Medications  DESCRIPTION:  This patient's order for:  Potassium tabs 99 mg  has been noted.  This product(s) is classified as an "herbal" or natural product. Due to a lack of definitive safety studies or FDA approval, nonstandard manufacturing practices, plus the potential risk of unknown drug-drug interactions while on inpatient medications, the Pharmacy and Therapeutics Committee does not permit the use of "herbal" or natural products of this type within [place].   ACTION TAKEN: The pharmacy department is unable to verify this order at this time. Please reevaluate patient's clinical condition at discharge and address if the herbal or natural product(s) should be resumed at that time.   

## 2016-07-16 NOTE — Anesthesia Preprocedure Evaluation (Addendum)
Anesthesia Evaluation  Patient identified by MRN, date of birth, ID band Patient awake    Reviewed: Allergy & Precautions, H&P , NPO status , Patient's Chart, lab work & pertinent test results  History of Anesthesia Complications Negative for: history of anesthetic complications  Airway Mallampati: III  TM Distance: <3 FB Neck ROM: limited    Dental no notable dental hx. (+) Poor Dentition, Chipped   Pulmonary neg pulmonary ROS, neg shortness of breath,    Pulmonary exam normal breath sounds clear to auscultation       Cardiovascular Exercise Tolerance: Good (-) angina(-) Past MI negative cardio ROS Normal cardiovascular exam Rhythm:regular Rate:Normal     Neuro/Psych negative neurological ROS  negative psych ROS   GI/Hepatic Neg liver ROS, GERD  Controlled,  Endo/Other  negative endocrine ROS  Renal/GU      Musculoskeletal  (+) Arthritis ,   Abdominal   Peds  Hematology negative hematology ROS (+)   Anesthesia Other Findings Patient reports no problems with vicodin  Past Medical History: No date: Arthritis No date: Cancer ([place])     Comment: Basal Cell left cheek, left earlobe No date: GERD (gastroesophageal reflux disease) No date: HOH (hard of hearing)     Comment: hearing aid left ear, deaf in right ear  Past Surgical History: 2010: [facility]: FOOT SURGERY Left     Comment: unsure if metal No date: INNER EAR SURGERY Bilateral 2007,2010: JOINT REPLACEMENT Bilateral     Comment: Total Knee Replacement No date: TONSILLECTOMY 2016: TOTAL SHOULDER REPLACEMENT Right  BMI    Body Mass Index:  35.12 kg/m      Reproductive/Obstetrics negative OB ROS                            Anesthesia Physical Anesthesia Plan  ASA: III  Anesthesia Plan: Spinal   Post-op Pain Management:    Induction:   Airway Management Planned:   Additional Equipment:   Intra-op Plan:    Post-operative Plan:   Informed Consent: I have reviewed the patients History and Physical, chart, labs and discussed the procedure including the risks, benefits and alternatives for the proposed anesthesia with the patient or authorized representative who has indicated his/her understanding and acceptance.   Dental Advisory Given  Plan Discussed with: Anesthesiologist, CRNA and Surgeon  Anesthesia Plan Comments:         Anesthesia Quick Evaluation

## 2016-07-16 NOTE — Progress Notes (Signed)
PHARMACIST - PHYSICIAN ORDER COMMUNICATION  CONCERNING: P&T Medication Policy on Herbal Medications  DESCRIPTION:  This patient's orders for:  Lysine, coconut oil capsules, vitamin B, and biotin have been noted.  This product(s) is classified as an "herbal" or natural product. Due to a lack of definitive safety studies or FDA approval, nonstandard manufacturing practices, plus the potential risk of unknown drug-drug interactions while on inpatient medications, the Pharmacy and Therapeutics Committee does not permit the use of "herbal" or natural products of this type within [facility].   ACTION TAKEN: The pharmacy department is unable to verify this order at this time and your patient has been informed of this safety policy. Please reevaluate patient's clinical condition at discharge and address if the herbal or natural product(s) should be resumed at that time.  Darrow Bussing, PharmD Pharmacy Resident 07/16/2016 6:38 PM

## 2016-07-16 NOTE — Op Note (Signed)
07/16/2016  4:44 PM  PATIENT:  [name]  75 y.o. female  PRE-OPERATIVE DIAGNOSIS:  PRIMARY OSTEOARTHRITIS left hip  POST-OPERATIVE DIAGNOSIS:  PRIMARY OSTEOARTHRITIS left hip  PROCEDURE:  Procedure(s): TOTAL HIP ARTHROPLASTY ANTERIOR APPROACH (Left)  SURGEON: Laurene Footman, MD  ASSISTANTS: None  ANESTHESIA:   spinal  EBL:  Total I/O In: 800 [I.V.:800] Out: 600 [Urine:300; Blood:300]  BLOOD ADMINISTERED:none  DRAINS: (2) Hemovact drain(s) in the Subcutaneous layer with  Suction Open   LOCAL MEDICATIONS USED:  MARCAINE     SPECIMEN:  Source of Specimen:  Left femoral head  DISPOSITION OF SPECIMEN:  PATHOLOGY  COUNTS:  YES  TOURNIQUET:  * No tourniquets in log *  IMPLANTS: Medacta AMIS size 2 standard stem with 48 mm Mpact DM cup and liner with S 28 mm head  DICTATION: .Dragon Dictation   The patient was brought to the operating room and after spinal anesthesia was obtained patient was placed on the operative table with the ipsilateral foot into the Medacta attachment, contralateral leg on a well-padded table. C-arm was brought in and preop template x-ray taken. After prepping and draping in usual sterile fashion appropriate patient identification and timeout procedures were completed. Anterior approach to the hip was obtained and centered over the greater trochanter and TFL muscle. The subcutaneous tissue was incised hemostasis being achieved by electrocautery. TFL fascia was incised and the muscle retracted laterally deep retractor placed. The lateral femoral circumflex vessels were identified and ligated. The anterior capsule was exposed and a capsulotomy performed. The neck was identified and a femoral neck cut carried out with a saw. The head was removed without difficulty and showed sclerotic femoral head and acetabulum. Reaming was carried out to 46 mm and a 48 mm cup trial gave appropriate tightness to the acetabular component a Mpact DM 48 mm cup was impacted into  position. The leg was then externally rotated and ischiofemoral and pubofemoral releases carried out. The femur was sequentially broached to a size 2, size 2 standard trials were placed and the final components chosen. The 2 standard width S head stem was inserted along with a S 28 mm head and 48 mm liner. The hip was reduced and was stable the wound was thoroughly irrigated. The deep fascia was closed using a heavy Quill after infiltration of 30 cc of quarter percent Sensorcaine with epinephrine. Subcutaneous drains were then inserted. 3-0 V-loc to close the skin with skin staples. Provena incisional wound VAC applied  PLAN OF CARE: Admit to inpatient

## 2016-07-16 NOTE — Anesthesia Procedure Notes (Signed)
Spinal  Patient location during procedure: OR Start time: 07/16/2016 3:18 PM End time: 07/16/2016 3:22 PM Staffing Anesthesiologist: Andria Frames Resident/CRNA: Rolla Plate Performed: anesthesiologist  Preanesthetic Checklist Completed: patient identified, site marked, surgical consent, pre-op evaluation, timeout performed, IV checked, risks and benefits discussed and monitors and equipment checked Spinal Block Patient position: sitting Prep: ChloraPrep Patient monitoring: heart rate, continuous pulse ox, blood pressure and cardiac monitor Approach: midline Location: L4-5 Injection technique: single-shot Needle Needle type: Whitacre and Introducer  Needle gauge: 24 G Needle length: 9 cm Assessment Sensory level: T10 Additional Notes First attempts by CRNA  Negative paresthesia. Negative blood return. Positive free-flowing CSF. Expiration date of kit checked and confirmed. Patient tolerated procedure well, without complications.

## 2016-07-17 ENCOUNTER — Encounter: Payer: Self-pay | Admitting: Orthopedic Surgery

## 2016-07-17 LAB — BASIC METABOLIC PANEL
Anion gap: 5 (ref 5–15)
BUN: 14 mg/dL (ref 6–20)
CHLORIDE: 104 mmol/L (ref 101–111)
CO2: 27 mmol/L (ref 22–32)
CREATININE: 0.59 mg/dL (ref 0.44–1.00)
Calcium: 8.5 mg/dL — ABNORMAL LOW (ref 8.9–10.3)
GFR calc Af Amer: >60 mL/min (ref >60)
GFR calc non Af Amer: >60 mL/min (ref >60)
GLUCOSE: 122 mg/dL — AB (ref 65–99)
POTASSIUM: 4 mmol/L (ref 3.5–5.1)
SODIUM: 136 mmol/L (ref 135–145)

## 2016-07-17 LAB — CBC
HCT: 33.7 % — ABNORMAL LOW (ref 35.0–47.0)
HEMOGLOBIN: 11.4 g/dL — AB (ref 12.0–16.0)
MCH: 31.9 pg (ref 26.0–34.0)
MCHC: 33.7 g/dL (ref 32.0–36.0)
MCV: 94.7 fL (ref 80.0–100.0)
Platelets: 204 10*3/uL (ref 150–440)
RBC: 3.56 MIL/uL — AB (ref 3.80–5.20)
RDW: 14 % (ref 11.5–14.5)
WBC: 7.9 10*3/uL (ref 3.6–11.0)

## 2016-07-17 MED ORDER — VITAMIN D 1000 UNITS PO TABS
1000.0000 [IU] | ORAL_TABLET | Freq: Every day | ORAL | Status: DC
  Administered 2016-07-17 – 2016-07-19 (×3): 1000 [IU] via ORAL
  Filled 2016-07-17 (×3): qty 1

## 2016-07-17 NOTE — NC FL2 (Signed)
[place] LEVEL OF CARE SCREENING TOOL     IDENTIFICATION  Patient Name: [name] Birthdate: 08-29-1941 Sex: female Admission Date (Current Location): 07/16/2016  [place] and [place] Number:  Engineering geologist and Address:  [facility], [address] [place], [place] [address]      Provider Number: [id number]  Attending Physician Name and Address:  Hessie Knows, MD  Relative Name and Phone Number:       Current Level of Care: Hospital Recommended Level of Care: [place] Prior Approval Number:    Date Approved/Denied:   PASRR Number:  ([id number])  Discharge Plan: SNF    Current Diagnoses: Patient Active Problem List   Diagnosis Date Noted  . Primary localized osteoarthritis of left hip 07/16/2016   Glaucoma    Hearing difficulty    Skin cancer       Orientation RESPIRATION BLADDER Height & Weight     Self, Time, Situation, Place  Normal Continent Weight: 192 lb (87.1 kg) Height:  5\' 2"  (157.5 cm)  BEHAVIORAL SYMPTOMS/MOOD NEUROLOGICAL BOWEL NUTRITION STATUS   (none)  (none) Continent Diet (Diet: Soft )  AMBULATORY STATUS COMMUNICATION OF NEEDS Skin   Extensive Assist Verbally Surgical wounds, Wound Vac (Incision: Left Hip. (Provena disposable wound vac) )                       Personal Care Assistance Level of Assistance  Bathing, Feeding, Dressing Bathing Assistance: Limited assistance Feeding assistance: Independent Dressing Assistance: Limited assistance     Functional Limitations Info  Sight, Hearing, Speech Sight Info: Impaired Hearing Info: Impaired Speech Info: Adequate    SPECIAL CARE FACTORS FREQUENCY  PT (By licensed PT), OT (By licensed OT)     PT Frequency:  (5) OT Frequency:  (5)            Contractures      Additional Factors Info  Code Status, Allergies Code Status Info:  (Full Code. ) Allergies Info:  (Percocet Oxycodone-acetaminophen)          Current Medications (07/17/2016):  This is the current hospital active medication list Current Facility-Administered Medications  Medication Dose Route Frequency Provider Last Rate Last Dose  . 0.9 %  sodium chloride infusion   Intravenous Continuous Hessie Knows, MD 100 mL/hr at 07/17/16 0525    . acetaminophen (TYLENOL) tablet 650 mg  650 mg Oral Q6H PRN Hessie Knows, MD       Or  . acetaminophen (TYLENOL) suppository 650 mg  650 mg Rectal Q6H PRN Hessie Knows, MD      . alum & mag hydroxide-simeth (MAALOX/MYLANTA) 200-200-20 MG/5ML suspension 30 mL  30 mL Oral Q4H PRN Hessie Knows, MD      . aspirin EC tablet 81 mg  81 mg Oral Daily Hessie Knows, MD   81 mg at 07/17/16 [id number]  . bisacodyl (DULCOLAX) suppository 10 mg  10 mg Rectal Daily PRN Hessie Knows, MD      . cholecalciferol (VITAMIN D) tablet 1,000 Units  1,000 Units Oral Daily Hessie Knows, MD   1,000 Units at 07/17/16 [phone]  . diphenhydrAMINE (BENADRYL) 12.5 MG/5ML elixir 12.5-25 mg  12.5-25 mg Oral Q4H PRN Hessie Knows, MD      . docusate sodium (COLACE) capsule 100 mg  100 mg Oral Daily Hessie Knows, MD   100 mg at 07/17/16 0919  . enoxaparin (LOVENOX) injection 40 mg  40 mg Subcutaneous Q24H Hessie Knows, MD  40 mg at 07/17/16 0919  . HYDROcodone-acetaminophen (NORCO) 7.5-325 MG per tablet 1-2 tablet  1-2 tablet Oral Q4H PRN Hessie Knows, MD   2 tablet at 07/17/16 [phone]  . magnesium citrate solution 1 Bottle  1 Bottle Oral Once PRN Hessie Knows, MD      . magnesium gluconate (MAGONATE) tablet 500 mg  500 mg Oral Daily Hessie Knows, MD   500 mg at 07/17/16 0932  . magnesium hydroxide (MILK OF MAGNESIA) suspension 30 mL  30 mL Oral Daily PRN Hessie Knows, MD      . menthol-cetylpyridinium (CEPACOL) lozenge 3 mg  1 lozenge Oral PRN Hessie Knows, MD       Or  . phenol (CHLORASEPTIC) mouth spray 1 spray  1 spray Mouth/Throat PRN Hessie Knows, MD      . methocarbamol (ROBAXIN) tablet 500 mg  500 mg Oral Q6H PRN Hessie Knows, MD   500 mg  at 07/17/16 0525   Or  . methocarbamol (ROBAXIN) 500 mg in dextrose 5 % 50 mL IVPB  500 mg Intravenous Q6H PRN Hessie Knows, MD      . metoCLOPramide (REGLAN) tablet 5-10 mg  5-10 mg Oral Q8H PRN Hessie Knows, MD       Or  . metoCLOPramide (REGLAN) injection 5-10 mg  5-10 mg Intravenous Q8H PRN Hessie Knows, MD      . morphine 2 MG/ML injection 2 mg  2 mg Intravenous Q1H PRN Hessie Knows, MD   2 mg at 07/16/16 1947  . multivitamin with minerals tablet 1 tablet  1 tablet Oral Daily Hessie Knows, MD   1 tablet at 07/17/16 0919  . ondansetron (ZOFRAN) tablet 4 mg  4 mg Oral Q6H PRN Hessie Knows, MD       Or  . ondansetron [facility]) injection 4 mg  4 mg Intravenous Q6H PRN Hessie Knows, MD   4 mg at 07/16/16 2020  . polycarbophil (FIBERCON) tablet 937.5 mg  937.5 mg Oral Daily Hessie Knows, MD   937.5 mg at 07/17/16 0932  . vitamin C (ASCORBIC ACID) tablet 1,000 mg  1,000 mg Oral Daily Hessie Knows, MD   1,000 mg at 07/17/16 0919  . zolpidem (AMBIEN) tablet 5 mg  5 mg Oral QHS PRN Hessie Knows, MD         Discharge Medications: Please see discharge summary for a list of discharge medications.  Relevant Imaging Results:  Relevant Lab Results:   Additional Information  (SSN: [id number])  Chong Wojdyla, Veronia Beets, LCSW

## 2016-07-17 NOTE — Evaluation (Signed)
Physical Therapy Evaluation Patient Details Name: [name] MRN: [id number] DOB: Jun 10, 1941 Today's Date: 07/17/2016   History of Present Illness  Pt is a 75 y/o F s/p L THA direct anterior approach.  Pt's PMH includes cancer (basal cell L cheek and earlobe), HOH.  Clinical Impression  Pt is s/p L THA resulting in the deficits listed below (see PT Problem List). Mrs. Swartwout was Independent using her cane prn PTA.  She works full time at [employer] at [employer] job.  She requires assist for bed mobility and close min guard assist for short distance ambulation.  Her husband will be at work during the day and will not be able to provide assist/supervision.  Given pt's current mobility status, home layout, and no assist available, recommending SNF at d/c. Pt will benefit from skilled PT to increase their independence and safety with mobility to allow discharge to the venue listed below.     Follow Up Recommendations SNF    Equipment Recommendations  None recommended by PT    Recommendations for Other Services OT consult     Precautions / Restrictions Precautions Precautions: Fall Precaution Comments: no hip precautions, direct anterior Restrictions Weight Bearing Restrictions: Yes LLE Weight Bearing: Weight bearing as tolerated      Mobility  Bed Mobility Overal bed mobility: Needs Assistance Bed Mobility: Supine to Sit     Supine to sit: Min assist;HOB elevated     General bed mobility comments: Pt with heavy use of bed rail and requires min assist to advance LLE to EOB.  Increased time and effort.  Transfers Overall transfer level: Needs assistance Equipment used: Rolling walker (2 wheeled) Transfers: Sit to/from Stand Sit to Stand: Min guard         General transfer comment: Increased time and effort.  Cues for hand placement and to stand upright.  Ambulation/Gait Ambulation/Gait assistance: Min guard Ambulation Distance (Feet): 40 Feet Assistive device:  Rolling walker (2 wheeled) Gait Pattern/deviations: Step-to pattern;Decreased stride length;Decreased weight shift to left;Decreased stance time - left;Decreased step length - right Gait velocity: decreased Gait velocity interpretation: <1.8 ft/sec, indicative of risk for recurrent falls General Gait Details: Cues for sequencing using RW and for upright posture and forward gaze.  Pt demonstrates slow gait speed and requests to return to room after ambualting 20 ft due to pain and fatigue.  Stairs            Wheelchair Mobility    Modified Rankin (Stroke Patients Only)       Balance Overall balance assessment: Needs assistance;History of Falls Sitting-balance support: No upper extremity supported;Feet supported Sitting balance-Leahy Scale: Good     Standing balance support: Bilateral upper extremity supported;During functional activity Standing balance-Leahy Scale: Poor Standing balance comment: Relies on RW for support                             Pertinent Vitals/Pain Pain Assessment: 0-10 Pain Score: 6  Pain Location: L hip when ambulating Pain Descriptors / Indicators: Aching;Grimacing;Moaning Pain Intervention(s): Limited activity within patient's tolerance;Monitored during session;Repositioned;Premedicated before session    Home Living Family/patient expects to be discharged to:: Private residence Living Arrangements: Spouse/significant other Available Help at Discharge: Friend(s);Available PRN/intermittently (Husband unable to take off work) Type of Home: House Home Access: Stairs to enter Entrance Stairs-Rails: Chemical engineer of Steps: 5 Home Layout: Two level;Able to live on main level with bedroom/bathroom Home Equipment: Gilford Rile - 2  wheels;Cane - single point;Bedside commode;Grab bars - tub/shower      Prior Function Level of Independence: Independent with assistive device(s)         Comments: 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 She was using a cane PTA in the am due to pain but otherwise independent.  Reports 1 fall ~6 months ago without injury.     Hand Dominance        Extremity/Trunk Assessment   Upper Extremity Assessment: Overall WFL for tasks assessed           Lower Extremity Assessment: LLE deficits/detail   LLE Deficits / Details: limited ROM and strength as expected s/p L THA  Cervical / Trunk Assessment: Normal  Communication   Communication: No difficulties  Cognition Arousal/Alertness: Awake/alert Behavior During Therapy: WFL for tasks assessed/performed Overall Cognitive Status: Within Functional Limits for tasks assessed                      General Comments      Exercises Total Joint Exercises Ankle Circles/Pumps: AROM;Both;15 reps;Supine Quad Sets: Strengthening;Both;10 reps;Supine Gluteal Sets: Strengthening;Both;10 reps;Supine Straight Leg Raises: Strengthening;AAROM;Left;10 reps;Seated Long Arc Quad: AROM;Left;10 reps;Seated   Assessment/Plan    PT Assessment Patient needs continued PT services  PT Problem List Decreased strength;Decreased range of motion;Decreased activity tolerance;Decreased balance;Decreased knowledge of use of DME;Decreased safety awareness;Pain          PT Treatment Interventions DME instruction;Gait training;Stair training;Functional mobility training;Therapeutic activities;Therapeutic exercise;Balance training;Neuromuscular re-education;Patient/family education;Modalities    PT Goals (Current goals can be found in the Care Plan section)  Acute Rehab PT Goals Patient Stated Goal: to be able to go walking and hiking with her husband PT Goal Formulation: With patient Time For Goal Achievement: 07/24/16 Potential to Achieve Goals: Good    Frequency BID   Barriers to discharge Inaccessible home environment;Decreased caregiver support Husband unable to assist at d/c    Co-evaluation                End of Session Equipment Utilized During Treatment: Gait belt Activity Tolerance: Patient tolerated treatment well;Patient limited by fatigue;Patient limited by pain Patient left: in chair;with call bell/phone within reach;with chair alarm set Nurse Communication: Mobility status;Weight bearing status         Time: [id number] PT Time Calculation (min) (ACUTE ONLY): 33 min   Charges:   PT Evaluation $PT Eval Low Complexity: 1 Procedure PT Treatments $Therapeutic Exercise: 8-22 mins   PT G Codes:        Collie Siad PT, DPT 07/17/2016, 10:27 AM

## 2016-07-17 NOTE — Clinical Social Work Note (Signed)
Clinical Social Work Assessment  Patient Details  Name: [name] MRN: [id number] Date of Birth: 04/11/1941  Date of referral:  07/17/16               Reason for consult:  Facility Placement                Permission sought to share information with:  Chartered certified accountant granted to share information::  Yes, Verbal Permission Granted  Name::      [place]::   [place]   Relationship::     Contact Information:     Housing/Transportation Living arrangements for the past 2 months:  [place] of Information:  Patient Patient Interpreter Needed:  None Criminal Activity/Legal Involvement Pertinent to Current Situation/Hospitalization:  No - Comment as needed Significant Relationships:  Spouse Lives with:  Spouse Do you feel safe going back to the place where you live?  Yes Need for family participation in patient care:  Yes (Comment)  Care giving concerns:  Patient lives in [place], [place] with her husband Marden Noble.    Social Worker assessment / plan:  Holiday representative (CSW) received SNF consult. PT is recommending SNF. CSW met with patient alone at bedside to discuss D/C plan. Patient was alert and oriented and was sitting in the chair. CSW introduced self and explained role of CSW department. Patient reported that she works full time at [employer] and is 2 weeks away from retirement. Patient reported that her primary insurance is BCBS because she still works. Patient reported that her husband works 3rd shift and will have a hard time taking care of her at home. CSW explained that BCBS will have to approve SNF and there is a chance they may deny SNF. Patient verbalized her understanding and is agreeable to SNF search in [place]. Patient prefers [facility] or [facility]. Per [facility] admissions coordinator at [facility] they don't have beds available.   FL2 complete and faxed out. CSW will continue to  follow and assist as needed.   Employment status:  Therapist, music:  Managed Care PT Recommendations:  [place] / Referral to community resources:  [place]  Patient/Family's Response to care:  Patient is agreeable to [facility].   Patient/Family's Understanding of and Emotional Response to Diagnosis, Current Treatment, and Prognosis:  Patient was pleasant and thanked CSW for visit.   Emotional Assessment Appearance:  Appears stated age Attitude/Demeanor/Rapport:    Affect (typically observed):  Accepting, Adaptable, Pleasant Orientation:  Oriented to Self, Oriented to Place, Oriented to  Time, Oriented to Situation Alcohol / Substance use:  Not Applicable Psych involvement (Current and /or in the community):  No (Comment)  Discharge Needs  Concerns to be addressed:  Discharge Planning Concerns Readmission within the last 30 days:  No Current discharge risk:  Dependent with Mobility Barriers to Discharge:  Continued Medical Work up   [employer], Veronia Beets, LCSW 07/17/2016, 12:05 PM

## 2016-07-17 NOTE — Progress Notes (Signed)
   Subjective: 1 Day Post-Op Procedure(s) (LRB): TOTAL HIP ARTHROPLASTY ANTERIOR APPROACH (Left) Patient reports pain as 2 on 0-10 scale and mild.   Patient is well, and has had no acute complaints or problems Denies any CP, SOB, ABD pain. We will start therapy today.  Plan is to go Home after hospital stay.  Objective: Vital signs in last 24 hours: Temp:  [96.7 F (35.9 C)-98.7 F (37.1 C)] 98.2 F (36.8 C) (11/15 0746) Pulse Rate:  [50-78] 73 (11/15 0746) Resp:  [10-19] 19 (11/15 0453) BP: (95-143)/(45-95) 104/46 (11/15 0746) SpO2:  [93 %-100 %] 96 % (11/15 0746) Weight:  [87.1 kg (192 lb)] 87.1 kg (192 lb) (11/14 1412)  Intake/Output from previous day: 11/14 0701 - 11/15 0700 In: 2296.7 [P.O.:240; I.V.:1856.7; IV Piggyback:200] Out: 2175 [Urine:1150; Emesis/NG output:725; Blood:300] Intake/Output this shift: No intake/output data recorded.   Recent Labs  07/16/16 1930 07/17/16 0459  HGB 12.3 11.4*    Recent Labs  07/16/16 1930 07/17/16 0459  WBC 11.6* 7.9  RBC 3.96 3.56*  HCT 37.7 33.7*  PLT 219 204    Recent Labs  07/16/16 1930 07/17/16 0459  NA  --  136  K  --  4.0  CL  --  104  CO2  --  27  BUN  --  14  CREATININE 0.56 0.59  GLUCOSE  --  122*  CALCIUM  --  8.5*   No results for input(s): LABPT, INR in the last 72 hours.  EXAM General - Patient is Alert, Appropriate and Oriented Extremity - Neurovascular intact Sensation intact distally Intact pulses distally Dorsiflexion/Plantar flexion intact No cellulitis present Compartment soft Dressing - dressing C/D/I and no drainage. hemovac and wound vac intact Motor Function - intact, moving foot and toes well on exam.   Past Medical History:  Diagnosis Date  . Arthritis   . Cancer (HCC)    Basal Cell left cheek, left earlobe  . GERD (gastroesophageal reflux disease)   . HOH (hard of hearing)    hearing aid left ear, deaf in right ear    Assessment/Plan:   1 Day Post-Op Procedure(s)  (LRB): TOTAL HIP ARTHROPLASTY ANTERIOR APPROACH (Left) Active Problems:   Primary localized osteoarthritis of left hip  Estimated body mass index is 35.12 kg/m as calculated from the following:   Height as of this encounter: 5\' 2"  (1.575 m).   Weight as of this encounter: 87.1 kg (192 lb). Advance diet Up with therapy  Needs BM Acute post op blood loss anemia - Hgb 11.4, recheck labs in the am CM to assist with discharge    DVT Prophylaxis - Lovenox, Foot Pumps and TED hose Weight-Bearing as tolerated to left leg   T. Rachelle Hora, PA-C [place] 07/17/2016, 8:09 AM

## 2016-07-17 NOTE — Clinical Social Work Placement (Addendum)
   CLINICAL SOCIAL WORK PLACEMENT  NOTE  Date:  07/17/2016  Patient Details  Name: [name] MRN: [id number] Date of Birth: 10-18-1940  Clinical Social Work is seeking post-discharge placement for this patient at the [place] level of care (*CSW will initial, date and re-position this form in  chart as items are completed):  Yes   Patient/family provided with [place] Work Department's list of facilities offering this level of care within the geographic area requested by the patient (or if unable, by the patient's family).  Yes   Patient/family informed of their freedom to choose among providers that offer the needed level of care, that participate in Medicare, Medicaid or managed care program needed by the patient, have an available bed and are willing to accept the patient.  Yes   Patient/family informed of [place]'s ownership interest in [facility] and [facility], as well as of the fact that they are under no obligation to receive care at these facilities.  PASRR submitted to EDS on 07/17/16     PASRR number received on 07/17/16     Existing PASRR number confirmed on       FL2 transmitted to all facilities in geographic area requested by pt/family on 07/17/16     FL2 transmitted to all facilities within larger geographic area on       Patient informed that his/her managed care company has contracts with or will negotiate with certain facilities, including the following:         07-18-16   Patient/family informed of bed offers received. (Updated Evette Cristal, MSW, 07-19-16)  Patient chooses bed at  [facility] (Updated Evette Cristal, MSW, 07-19-16)     Physician recommends and patient chooses bed at      Patient to be transferred to  [facility] on  07-19-16 (Updated Evette Cristal, MSW, 07-19-16).  Patient to be transferred to facility by  [facility] (Updated Evette Cristal, MSW, 07-19-16)      Patient family notified on  07-19-16 of transfer. (Updated Evette Cristal, MSW, 07-19-16)  Name of family member notified:   Patient notified her husband (Updated Evette Cristal, MSW, 07-19-16)     PHYSICIAN       Additional Comment:    _______________________________________________ Sample, Veronia Beets, LCSW 07/17/2016, 12:04 PM

## 2016-07-17 NOTE — Anesthesia Postprocedure Evaluation (Signed)
Anesthesia Post Note  Patient: [name]  Procedure(s) Performed: Procedure(s) (LRB): TOTAL HIP ARTHROPLASTY ANTERIOR APPROACH (Left)  Patient location during evaluation: Other Anesthesia Type: Spinal Level of consciousness: awake, awake and alert and oriented Pain management: pain level controlled Vital Signs Assessment: post-procedure vital signs reviewed and stable Respiratory status: spontaneous breathing, nonlabored ventilation and respiratory function stable Cardiovascular status: stable Postop Assessment: no backache Anesthetic complications: no    Last Vitals:  Vitals:   07/16/16 2250 07/17/16 0453  BP: (!) 143/70 (!) 113/49  Pulse: 73 78  Resp: 19 19  Temp: 36.9 C 36.9 C    Last Pain:  Vitals:   07/17/16 0625  TempSrc:   PainSc: Asleep                 Lance Muss

## 2016-07-17 NOTE — Evaluation (Signed)
Occupational Therapy Evaluation Patient Details Name: [name] MRN: [id number] DOB: 1940-11-20 Today's Date: 07/17/2016    History of Present Illness Pt is a 75 y/o F s/p L THA direct anterior approach.  Pt's PMH includes cancer (basal cell L cheek and earlobe), HOH.   Clinical Impression    Pt. is a 75 y.o. female who was admitted to [facility] for a L THA (anterior) by Dr Rudene Christians. 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  Her husband also works full time 3rd shift and will not be able to take any time off to help her at home.  She currently needs mod assist and cues for LB dressing for  LLE and min cues for RLE using reacher and sock aid due to pain in L hip area.  She also has limited ROM, weakness, limited activity tolerance, and impaired functional mobility for ADLs. Patient could benefit from skilled OT services for ADL and A/E retraining, and functional mobility for ADLs in order to improve overall ADL functioning, and return to PLOF.  She would benefit from SNF to increase  Independence in ADLs in order to return to PLOF and decrease risk of falls at home. Pt is very motivated to return to PLOF.    Follow Up Recommendations  SNF    Equipment Recommendations  Tub/shower seat (reacher and possibly sock aid)    Recommendations for Other Services       Precautions / Restrictions Precautions Precautions: Fall Precaution Comments: no hip precautions, direct anterior Restrictions Weight Bearing Restrictions: Yes LLE Weight Bearing: Weight bearing as tolerated      Mobility Bed Mobility                  Transfers                      Balance                                            ADL Overall ADL's : Needs assistance/impaired Eating/Feeding: Independent;Set up   Grooming: Wash/dry hands;Wash/dry face;Oral care;Applying deodorant;Brushing hair;Independent;Set up            Upper Body Dressing : Independent;Set up   Lower Body Dressing: Moderate assistance;Set up;With adaptive equipment;Sit to/from stand Lower Body Dressing Details (indicate cue type and reason): Mod assist for LLE and for standing due to increased pain; min cues for RLE using reacher and sock aid                     Vision     Perception     Praxis      Pertinent Vitals/Pain Pain Assessment: 0-10 Pain Score: 2  Pain Location: L hip Pain Descriptors / Indicators: Aching Pain Intervention(s): Limited activity within patient's tolerance;Monitored during session;Premedicated before session     Hand Dominance Right   Extremity/Trunk Assessment Upper Extremity Assessment Upper Extremity Assessment: Overall WFL for tasks assessed   Lower Extremity Assessment Lower Extremity Assessment: Defer to PT evaluation   Cervical / Trunk Assessment Cervical / Trunk Assessment: Normal   Communication Communication Communication: HOH   Cognition Arousal/Alertness: Awake/alert Behavior During Therapy: WFL for tasks assessed/performed Overall Cognitive Status: Within Functional Limits for tasks assessed  General Comments       Exercises       Shoulder Instructions      Home Living Family/patient expects to be discharged to:: Private residence Living Arrangements: Spouse/significant other Available Help at Discharge: Friend(s);Available PRN/intermittently Type of Home: House Home Access: Stairs to enter [employer] of Steps: 5 Entrance Stairs-Rails: Left;Right Home Layout: Two level;Able to live on main level with bedroom/bathroom Alternate Level Stairs-Number of Steps: flight   Bathroom Shower/Tub: Occupational psychologist: Standard Bathroom Accessibility: Yes How Accessible: Accessible via walker Home Equipment: [place] - 2 wheels;Cane - single point;Bedside commode;Grab bars - tub/shower          Prior  Functioning/Environment Level of Independence: Independent with assistive device(s)        Comments: 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 She was using a cane PTA in the am due to pain but otherwise independent.  Reports 1 fall ~6 months ago without injury.        OT Problem List: Decreased strength;Decreased range of motion;Decreased activity tolerance;Pain;Decreased knowledge of use of DME or AE   OT Treatment/Interventions: Self-care/ADL training;DME and/or AE instruction;Patient/family education    OT Goals(Current goals can be found in the care plan section) Acute Rehab OT Goals Patient Stated Goal: "to be independent again in taking care of myself" OT Goal Formulation: With patient Time For Goal Achievement: 07/17/16 Potential to Achieve Goals: Good ADL Goals Pt Will Perform Lower Body Dressing: with set-up;with min assist;with adaptive equipment;sit to/from stand (with or without AD with FWW and no LOB) Pt Will Transfer to Toilet: with set-up;with min assist;bedside commode;stand pivot transfer (BSC over toilet to simulate home and FWW) Pt Will Perform Toileting - Clothing Manipulation and hygiene: with min assist;sit to/from stand  OT Frequency: Min 1X/week   Barriers to D/C:    husband works 3rd shift until the end of November and then is staring new job at [employer] on 2nd shift       Co-evaluation              End of Session    Activity Tolerance: Patient limited by pain Patient left: in chair;with call bell/phone within reach;with chair alarm set   Time: 1300-1340 OT Time Calculation (min): 40 min Charges:  OT General Charges $OT Visit: 1 Procedure OT Evaluation $OT Eval Moderate Complexity: 1 Procedure OT Treatments $Self Care/Home Management : 23-37 mins G-Codes:    Chrys Racer, OTR/L ascom [phone] 07/17/16, 2:11 PM

## 2016-07-17 NOTE — Progress Notes (Signed)
Physical Therapy Treatment Patient Details Name: [name] MRN: [id number] DOB: 02/06/1941 Today's Date: 07/17/2016    History of Present Illness Pt is a 75 y/o F s/p L THA direct anterior approach.  Pt's PMH includes cancer (basal cell L cheek and earlobe), HOH.    PT Comments    [name] is making good progress but continues to require min assist for bed mobility and close min guard assist with transfers and ambulation.  Pt ambulated 80 ft and was able to return demonstration of step through gait pattern this afternoon. She tolerated all interventions well this date. SNF remains most appropriate d/c plan at this time.  Pt will benefit from continued skilled PT services to increase functional independence and safety.   Follow Up Recommendations  SNF     Equipment Recommendations  None recommended by PT    Recommendations for Other Services       Precautions / Restrictions Precautions Precautions: Fall Precaution Comments: no hip precautions, direct anterior Restrictions Weight Bearing Restrictions: Yes LLE Weight Bearing: Weight bearing as tolerated    Mobility  Bed Mobility Overal bed mobility: Needs Assistance Bed Mobility: Sit to Supine       Sit to supine: Min assist   General bed mobility comments: Min assist with bringing LLE into bed.  Increased effort and time.  Cues provided for sequencing.  Transfers Overall transfer level: Needs assistance Equipment used: Rolling walker (2 wheeled) Transfers: Sit to/from Stand Sit to Stand: Min guard         General transfer comment: Increased time and effort.  Cues for hand placement and to stand upright.  Ambulation/Gait Ambulation/Gait assistance: Min guard Ambulation Distance (Feet): 80 Feet Assistive device: Rolling walker (2 wheeled) Gait Pattern/deviations: Step-to pattern;Step-through pattern;Decreased stride length;Decreased stance time - left;Decreased weight shift to left;Decreased step length -  right;Antalgic;Trunk flexed Gait velocity: decreased Gait velocity interpretation: Below normal speed for age/gender General Gait Details: Cues for sequencing using RW and for upright posture and forward gaze.  Demonstrated step through gait pattern to pt and pt able to return demonstration.     Stairs            Wheelchair Mobility    Modified Rankin (Stroke Patients Only)       Balance Overall balance assessment: Needs assistance Sitting-balance support: No upper extremity supported;Feet supported Sitting balance-Leahy Scale: Good     Standing balance support: Bilateral upper extremity supported;During functional activity Standing balance-Leahy Scale: Poor Standing balance comment: Relies on RW for support                    Cognition Arousal/Alertness: Awake/alert Behavior During Therapy: WFL for tasks assessed/performed Overall Cognitive Status: Within Functional Limits for tasks assessed                      Exercises Total Joint Exercises Ankle Circles/Pumps: AROM;Both;15 reps;Seated Quad Sets: Strengthening;Both;10 reps;Seated Gluteal Sets: Strengthening;Both;10 reps;Supine Hip ABduction/ADduction: AAROM;Left;10 reps;Seated;Strengthening Straight Leg Raises: Strengthening;AAROM;Left;10 reps;Seated Long Arc Quad: AROM;Left;10 reps;Seated Knee Flexion: AROM;Left;10 reps;Standing    General Comments        Pertinent Vitals/Pain Pain Assessment: Faces Pain Score: 2  Faces Pain Scale: Hurts even more Pain Location: L hip Pain Descriptors / Indicators: Grimacing;Guarding;Moaning Pain Intervention(s): Limited activity within patient's tolerance;Monitored during session;Repositioned    Home Living Family/patient expects to be discharged to:: Private residence Living Arrangements: Spouse/significant other Available Help at Discharge: Friend(s);Available PRN/intermittently Type of Home: House Home Access:  Stairs to enter Entrance  Stairs-Rails: Left;Right Home Layout: Two level;Able to live on main level with bedroom/bathroom Home Equipment: [place] - 2 wheels;Cane - single point;Bedside commode;Grab bars - tub/shower      Prior Function Level of Independence: Independent with assistive device(s)      Comments: 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 She was using a cane PTA in the am due to pain but otherwise independent.  Reports 1 fall ~6 months ago without injury.   PT Goals (current goals can now be found in the care plan section) Acute Rehab PT Goals Patient Stated Goal: to be able to go walking and hiking with her husband PT Goal Formulation: With patient Time For Goal Achievement: 07/24/16 Potential to Achieve Goals: Good Progress towards PT goals: Progressing toward goals    Frequency    BID      PT Plan Current plan remains appropriate    Co-evaluation             End of Session Equipment Utilized During Treatment: Gait belt Activity Tolerance: Patient tolerated treatment well;Patient limited by fatigue;Patient limited by pain Patient left: with call bell/phone within reach;in bed;with bed alarm set     Time: 1356-1419 PT Time Calculation (min) (ACUTE ONLY): 23 min  Charges:  $Gait Training: 8-22 mins $Therapeutic Exercise: 8-22 mins                    G Codes:       Collie Siad PT, DPT 07/17/2016, 3:28 PM

## 2016-07-18 LAB — BASIC METABOLIC PANEL
ANION GAP: 5 (ref 5–15)
BUN: 10 mg/dL (ref 6–20)
CO2: 27 mmol/L (ref 22–32)
Calcium: 8.3 mg/dL — ABNORMAL LOW (ref 8.9–10.3)
Chloride: 103 mmol/L (ref 101–111)
Creatinine, Ser: 0.52 mg/dL (ref 0.44–1.00)
GFR calc Af Amer: >60 mL/min (ref >60)
GLUCOSE: 109 mg/dL — AB (ref 65–99)
POTASSIUM: 4.1 mmol/L (ref 3.5–5.1)
SODIUM: 135 mmol/L (ref 135–145)

## 2016-07-18 LAB — CBC
HCT: 32.7 % — ABNORMAL LOW (ref 35.0–47.0)
Hemoglobin: 11.1 g/dL — ABNORMAL LOW (ref 12.0–16.0)
MCH: 31.8 pg (ref 26.0–34.0)
MCHC: 34 g/dL (ref 32.0–36.0)
MCV: 93.5 fL (ref 80.0–100.0)
PLATELETS: 171 10*3/uL (ref 150–440)
RBC: 3.5 MIL/uL — AB (ref 3.80–5.20)
RDW: 13.8 % (ref 11.5–14.5)
WBC: 9.1 10*3/uL (ref 3.6–11.0)

## 2016-07-18 LAB — SURGICAL PATHOLOGY

## 2016-07-18 MED ORDER — ENOXAPARIN SODIUM 40 MG/0.4ML ~~LOC~~ SOLN: 40.0000 mg | SUBCUTANEOUS | 0 refills | Status: DC

## 2016-07-18 MED ORDER — HYDROCODONE-ACETAMINOPHEN 7.5-325 MG PO TABS: 1.0000 | ORAL_TABLET | ORAL | 0 refills | Status: DC | PRN

## 2016-07-18 NOTE — Progress Notes (Signed)
Occupational Therapy Treatment Patient Details Name: MESHIA CRAPPS MRN: [id number] DOB: 18-Jan-1941 Today's Date: 07/18/2016    History of present illness Pt is a 75 y/o F s/p L THA direct anterior approach.  Pt's PMH includes cancer (basal cell L cheek and earlobe), HOH.   OT comments  Reviewed use of AD for LB dressing skills and set up of bathroom for transfers.  Pt has higher toilets at home and has a BSC for over toilet as well but does not have the bucket in order to use it next to bed.  Plan to practice AD when up in chair tomorrow morning to assess for AD needs based on pain and trunk flexion in sitting.    Follow Up Recommendations  SNF    Equipment Recommendations  Tub/shower seat    Recommendations for Other Services      Precautions / Restrictions Precautions Precautions: Fall Precaution Comments: no hip precautions, direct anterior Restrictions Weight Bearing Restrictions: Yes LLE Weight Bearing: Weight bearing as tolerated       Mobility Bed Mobility                  Transfers                      Balance                                   ADL Overall ADL's : Needs assistance/impaired                                       General ADL Comments: Reviewed use of AD for LB dressing skills and set up of bathroom for transfers.  Pt has higher toilets at home and has a BSC for over toilet as well but does not have the bucket in order to use it next to bed.  Plan to practice AD when up in chair tomorrow morning to assess for AD needs based on pain and trunk flexion in sitting.      Vision                     Perception     Praxis      Cognition   Behavior During Therapy: WFL for tasks assessed/performed Overall Cognitive Status: Within Functional Limits for tasks assessed                       Extremity/Trunk Assessment               Exercises     Shoulder Instructions        General Comments      Pertinent Vitals/ Pain       Pain Assessment: 0-10 Pain Score: 2  Pain Location: L thigh Pain Descriptors / Indicators: Discomfort;Operative site guarding Pain Intervention(s): Limited activity within patient's tolerance;Premedicated before session  Home Living                                          Prior Functioning/Environment              Frequency  Min 1X/week        Progress Toward Goals  OT Goals(current  goals can now be found in the care plan section)  Progress towards OT goals: Progressing toward goals     Plan Discharge plan remains appropriate    Co-evaluation                 End of Session     Activity Tolerance Patient tolerated treatment well   Patient Left in bed;with call bell/phone within reach;with bed alarm set   Nurse Communication          Time: 1555-1610 OT Time Calculation (min): 15 min  Charges: OT General Charges $OT Visit: 1 Procedure OT Treatments $Self Care/Home Management : 8-22 mins  Chrys Racer, OTR/L ascom [phone] 07/18/16, 4:21 PM

## 2016-07-18 NOTE — Discharge Instructions (Signed)

## 2016-07-18 NOTE — Progress Notes (Signed)
   Subjective: 2 Days Post-Op Procedure(s) (LRB): TOTAL HIP ARTHROPLASTY ANTERIOR APPROACH (Left) Patient reports pain as mild.   Patient is well, and has had no acute complaints or problems Denies any CP, SOB, ABD pain. We will continue therapy today.  Plan is to go Skilled nursing facility after hospital stay.  Objective: Vital signs in last 24 hours: Temp:  [98.3 F (36.8 C)-99.2 F (37.3 C)] 99.2 F (37.3 C) (11/16 0422) Pulse Rate:  [78-89] 89 (11/16 0422) Resp:  [16-18] 18 (11/16 0422) BP: (115-130)/(41-56) 121/48 (11/16 0422) SpO2:  [95 %-97 %] 95 % (11/16 0422)  Intake/Output from previous day: 11/15 0701 - 11/16 0700 In: 840 [P.O.:840] Out: 1015 [Urine:975; Drains:40] Intake/Output this shift: No intake/output data recorded.   Recent Labs  07/16/16 1930 07/17/16 0459 07/18/16 0538  HGB 12.3 11.4* 11.1*    Recent Labs  07/17/16 0459 07/18/16 0538  WBC 7.9 9.1  RBC 3.56* 3.50*  HCT 33.7* 32.7*  PLT 204 171    Recent Labs  07/17/16 0459 07/18/16 0538  NA 136 135  K 4.0 4.1  CL 104 103  CO2 27 27  BUN 14 10  CREATININE 0.59 0.52  GLUCOSE 122* 109*  CALCIUM 8.5* 8.3*   No results for input(s): LABPT, INR in the last 72 hours.  EXAM General - Patient is Alert, Appropriate and Oriented Extremity - Neurovascular intact Sensation intact distally Intact pulses distally Dorsiflexion/Plantar flexion intact No cellulitis present Compartment soft Dressing - dressing C/D/I and no drainage. hemovac removed, wound vac intact Motor Function - intact, moving foot and toes well on exam.   Past Medical History:  Diagnosis Date  . Arthritis   . Cancer (HCC)    Basal Cell left cheek, left earlobe  . GERD (gastroesophageal reflux disease)   . HOH (hard of hearing)    hearing aid left ear, deaf in right ear    Assessment/Plan:   2 Days Post-Op Procedure(s) (LRB): TOTAL HIP ARTHROPLASTY ANTERIOR APPROACH (Left) Active Problems:   Primary localized  osteoarthritis of left hip  Estimated body mass index is 35.12 kg/m as calculated from the following:   Height as of this encounter: 5\' 2"  (1.575 m).   Weight as of this encounter: 87.1 kg (192 lb). Advance diet Up with therapy  Needs BM Acute post op blood loss anemia - Hgb 11.1, stable CM to assist with discharge, SNF tomorrow    DVT Prophylaxis - Lovenox, Foot Pumps and TED hose Weight-Bearing as tolerated to left leg   T. Rachelle Hora, PA-C [place] 07/18/2016, 8:16 AM

## 2016-07-18 NOTE — Progress Notes (Addendum)
Social work Theatre manager met with patient at bedside. Patient was alert and oriented. Social Work Theatre manager presented bed offers. Patient chose [facility]. [facility] admissions coordinator at [facility] is aware of above and has started [employer] authorization. Magda Paganini said she would accept a 5 day LOG if authorization is not received by the time patient is discharged. Social work Theatre manager will continue to assist and follow as needed.   Danie Chandler, Social Work Intern  [phone]

## 2016-07-18 NOTE — Progress Notes (Signed)
Physical Therapy Treatment Patient Details Name: [name] MRN: [id number] DOB: 16-Nov-1940 Today's Date: 07/18/2016    History of Present Illness Pt is a 75 y/o F s/p L THA direct anterior approach.  Pt's PMH includes cancer (basal cell L cheek and earlobe), HOH.    PT Comments    Pt in bed, ready for session.  Participated in exercises as described below.  Pt able to get up with min a x 1 to edge of bed.  Stood and was able to ambulate 120' with rw and min guard.  Initially step-to but was able to progress to step through pattern.  Gait remains slow but generally steady.     Follow Up Recommendations  SNF     Equipment Recommendations  None recommended by PT    Recommendations for Other Services       Precautions / Restrictions Precautions Precautions: Fall Precaution Comments: no hip precautions, direct anterior Restrictions Weight Bearing Restrictions: Yes LLE Weight Bearing: Weight bearing as tolerated    Mobility  Bed Mobility Overal bed mobility: Needs Assistance Bed Mobility: Supine to Sit     Supine to sit: Min assist;HOB elevated        Transfers Overall transfer level: Needs assistance Equipment used: Rolling walker (2 wheeled) Transfers: Sit to/from Stand Sit to Stand: Min guard         General transfer comment: increased time and effort.  Ambulation/Gait Ambulation/Gait assistance: Min guard Ambulation Distance (Feet): 120 Feet Assistive device: Rolling walker (2 wheeled) Gait Pattern/deviations: Step-to pattern;Step-through pattern Gait velocity: decreased Gait velocity interpretation: Below normal speed for age/gender General Gait Details: gait slow and cautious but no lob's   Stairs            Wheelchair Mobility    Modified Rankin (Stroke Patients Only)       Balance Overall balance assessment: Needs assistance Sitting-balance support: Feet supported Sitting balance-Leahy Scale: Good     Standing balance support:  Bilateral upper extremity supported Standing balance-Leahy Scale: Poor Standing balance comment: relies on RW for support                    Cognition Arousal/Alertness: Awake/alert Behavior During Therapy: WFL for tasks assessed/performed Overall Cognitive Status: Within Functional Limits for tasks assessed                      Exercises Total Joint Exercises Ankle Circles/Pumps: AROM;Both;15 reps;Seated Quad Sets: Strengthening;Both;10 reps;Seated Gluteal Sets: Strengthening;Both;10 reps;Supine Heel Slides: AAROM;Left;10 reps;Supine Hip ABduction/ADduction: AAROM;Left;10 reps;Supine Long Arc Quad: AROM;Left;10 reps;Seated    General Comments        Pertinent Vitals/Pain Pain Assessment: 0-10 Pain Score: 2  Pain Location: L thigh Pain Descriptors / Indicators: Discomfort;Grimacing;Operative site guarding Pain Intervention(s): Limited activity within patient's tolerance    Home Living                      Prior Function            PT Goals (current goals can now be found in the care plan section) Progress towards PT goals: Progressing toward goals    Frequency    BID      PT Plan Current plan remains appropriate    Co-evaluation             End of Session Equipment Utilized During Treatment: Gait belt Activity Tolerance: Patient tolerated treatment well;Patient limited by fatigue;Patient limited by pain Patient left: in bed;with  call bell/phone within reach;with chair alarm set     Time: [phone] PT Time Calculation (min) (ACUTE ONLY): 23 min  Charges:  $Gait Training: 8-22 mins $Therapeutic Exercise: 8-22 mins                    G Codes:      [name] 08-03-2016, 10:17 AM

## 2016-07-18 NOTE — Progress Notes (Signed)
Physical Therapy Treatment Patient Details Name: [name] MRN: [id number] DOB: August 12, 1941 Today's Date: 07/18/2016    History of Present Illness Pt is a 75 y/o F s/p L THA direct anterior approach.  Pt's PMH includes cancer (basal cell L cheek and earlobe), HOH.    PT Comments    Pt in chair, needing to use bathroom.  She was able to stand and ambulate into restroom with RW and min guard.  She continues to ambulate an additional 100' in hallway.  Participated in exercises as described below. She was assisted back to bed with min assist to manage LLE.   Gait slow and cautious but no lob's noted.   Follow Up Recommendations  SNF     Equipment Recommendations  None recommended by PT    Recommendations for Other Services       Precautions / Restrictions Precautions Precautions: Fall Precaution Comments: no hip precautions, direct anterior Restrictions Weight Bearing Restrictions: Yes LLE Weight Bearing: Weight bearing as tolerated    Mobility  Bed Mobility Overal bed mobility: Needs Assistance Bed Mobility: Sit to Supine     Supine to sit: Min assist;HOB elevated Sit to supine: Min assist   General bed mobility comments: assist for LLE - she is unable to manage independantly at this time  Transfers Overall transfer level: Needs assistance Equipment used: Rolling walker (2 wheeled) Transfers: Sit to/from Stand Sit to Stand: Min guard         General transfer comment: increased time and effort.  Ambulation/Gait Ambulation/Gait assistance: Min guard Ambulation Distance (Feet): 100 Feet Assistive device: Rolling walker (2 wheeled) Gait Pattern/deviations: Step-through pattern Gait velocity: decreased Gait velocity interpretation: Below normal speed for age/gender General Gait Details: gait slow and cautious but no lob's   Stairs            Wheelchair Mobility    Modified Rankin (Stroke Patients Only)       Balance Overall balance  assessment: Needs assistance Sitting-balance support: Feet supported Sitting balance-Leahy Scale: Good     Standing balance support: Bilateral upper extremity supported Standing balance-Leahy Scale: Fair Standing balance comment: relies on RW for support                    Cognition Arousal/Alertness: Awake/alert Behavior During Therapy: WFL for tasks assessed/performed Overall Cognitive Status: Within Functional Limits for tasks assessed                      Exercises Total Joint Exercises Ankle Circles/Pumps: AROM;Both;15 reps;Seated Quad Sets: Strengthening;Both;10 reps;Seated Gluteal Sets: Strengthening;Both;10 reps;Supine Heel Slides: AAROM;Left;10 reps;Supine Hip ABduction/ADduction: AROM;Right;10 reps;Standing Straight Leg Raises: AROM;Left;10 reps;Standing Long Arc Quad: AROM;Left;10 reps;Seated Marching in Standing: AROM;Left;10 reps;Standing Other Exercises Other Exercises: to bathroom, no BM    General Comments        Pertinent Vitals/Pain Pain Assessment: 0-10 Pain Score: 4  Pain Location: L thigh Pain Descriptors / Indicators: Discomfort;Operative site guarding Pain Intervention(s): Limited activity within patient's tolerance;Ice applied    Home Living                      Prior Function            PT Goals (current goals can now be found in the care plan section) Progress towards PT goals: Progressing toward goals    Frequency    BID      PT Plan Current plan remains appropriate    Co-evaluation  End of Session Equipment Utilized During Treatment: Gait belt Activity Tolerance: Patient tolerated treatment well;Patient limited by fatigue;Patient limited by pain Patient left: in bed;with call bell/phone within reach;with chair alarm set     Time: 1130-1153 PT Time Calculation (min) (ACUTE ONLY): 23 min  Charges:  $Gait Training: 8-22 mins $Therapeutic Exercise: 8-22 mins                    G  Codes:      [name] 08/10/16, 11:58 AM

## 2016-07-18 NOTE — Discharge Summary (Signed)
Physician Discharge Summary  Patient ID: [name] MRN: [id number] DOB/AGE: [birth year] 75 y.o.  Admit date: 07/16/2016 Discharge date: 07/19/2016 Admission Diagnoses:  primary osteoarthritis   Discharge Diagnoses: Patient Active Problem List   Diagnosis Date Noted  . Primary localized osteoarthritis of left hip 07/16/2016    Past Medical History:  Diagnosis Date  . Arthritis   . Cancer (HCC)    Basal Cell left cheek, left earlobe  . GERD (gastroesophageal reflux disease)   . HOH (hard of hearing)    hearing aid left ear, deaf in right ear     Transfusion: None   Consultants (if any):   Discharged Condition: Improved  Hospital Course: [name] is an 75 y.o. female who was admitted 07/16/2016 with a diagnosis of hip osteoarthritis and went to the operating room on 07/16/2016 and underwent the above named procedures.    Surgeries: Procedure(s): TOTAL HIP ARTHROPLASTY ANTERIOR APPROACH on 07/16/2016 Patient tolerated the surgery well. Taken to PACU where she was stabilized and then transferred to the orthopedic floor.  Started on Lovenox 40 q 24 hrs. Foot pumps applied bilaterally at 80 mm. Heels elevated on bed with rolled towels. No evidence of DVT. Negative Homan. Physical therapy started on day #1 for gait training and transfer. OT started day #1 for ADL and assisted devices.  Patient's foley was d/c on day #1. Patient's IV and hemovac was d/c on day #2.  On post op day #3 patient was stable and ready for discharge to skilled nursing facility.  Implants: On 07/23/2016-07/24/2016 wound VAC battery will expire, remove wound VAC completely and dispose of in the trash. Apply sterile dressing to incision site. Follow-up in the clinic 2 weeks postop for staple removal and station application.  She was given perioperative antibiotics:  Anti-infectives    Start     Dose/Rate Route Frequency Ordered Stop   07/16/16 1900  ceFAZolin (ANCEF) IVPB 2g/100 mL premix     2  g 200 mL/hr over 30 Minutes Intravenous Every 6 hours 07/16/16 1816 07/17/16 0949   07/16/16 1149  ceFAZolin (ANCEF) 2-4 GM/100ML-% IVPB    Comments:  STOLLEY, LORI: cabinet override      07/16/16 1149 07/16/16 1529   07/16/16 0500  ceFAZolin (ANCEF) IVPB 2g/100 mL premix     2 g 200 mL/hr over 30 Minutes Intravenous  Once 07/16/16 0446 07/16/16 1544    .  She was given sequential compression devices, early ambulation, and Lovenox for DVT prophylaxis.  She benefited maximally from the hospital stay and there were no complications.    On 07/23/2016-07/24/2016 wound VAC battery will expire, remove wound VAC completely and dispose of in the trash. Apply sterile dressing to incision site. Follow-up in the clinic 2 weeks postop for staple removal and station application.  Recent vital signs:  Vitals:   07/18/16 1413 07/18/16 2028  BP: 134/65 (!) 109/46  Pulse: 96 97  Resp: 19 18  Temp: 98.2 F (36.8 C) 97.9 F (36.6 C)    Recent laboratory studies:  Lab Results  Component Value Date   HGB 11.1 (L) 07/18/2016   HGB 11.4 (L) 07/17/2016   HGB 12.3 07/16/2016   Lab Results  Component Value Date   WBC 9.1 07/18/2016   PLT 171 07/18/2016   Lab Results  Component Value Date   INR 0.95 07/03/2016   Lab Results  Component Value Date   NA 135 07/18/2016   K 4.1 07/18/2016   CL 103 07/18/2016  CO2 27 07/18/2016   BUN 10 07/18/2016   CREATININE 0.52 07/18/2016   GLUCOSE 109 (H) 07/18/2016    Discharge Medications:     Medication List    TAKE these medications   aspirin EC 81 MG tablet Take 81 mg by mouth daily.   BIOTIN 5000 5 MG Caps Generic drug:  Biotin Take 5 mg by mouth daily.   CHROMIUM-MANGANESE-ZINC PO Take 1 tablet by mouth daily.   Coconut Oil 1000 MG Caps Take 1 capsule by mouth daily.   docusate sodium 100 MG capsule Commonly known as:  COLACE Take 100 mg by mouth daily.   enoxaparin 40 MG/0.4ML injection Commonly known as:  LOVENOX Inject  0.4 mLs (40 mg total) into the skin daily. Start taking on:  07/19/2016   Evening Primrose Oil 1000 MG Caps Take 1,000 mg by mouth daily.   FIBERCON PO Take 1,000 mg by mouth daily.   Fish Oil 1000 MG Cpdr Take 1,000 mg by mouth daily.   Grape Seed Extract 100 MG Caps Take 2 capsules by mouth 3 (three) times daily.   HYDROcodone-acetaminophen 7.5-325 MG tablet Commonly known as:  NORCO Take 1-2 tablets by mouth every 4 (four) hours as needed (breakthrough pain).   magnesium gluconate 500 MG tablet Commonly known as:  MAGONATE Take 500 mg by mouth daily.   MULTI-VITAMIN tablet Take 1 tablet by mouth daily.   Potassium 99 MG Tabs Take 1 tablet by mouth daily.   RA L-LYSINE 1000 MG Tabs Generic drug:  Lysine HCl Take 1,000 mg by mouth daily.   VITAMIN B COMPLEX PO Take 1 capsule by mouth daily.   vitamin C 1000 MG tablet Take 1,000 mg by mouth daily.   Vitamin D3 10000 units Tabs Take 1 tablet by mouth daily.            Durable Medical Equipment        Start     Ordered   07/16/16 1817  DME Bedside commode  Once     07/16/16 1816   07/16/16 1817  DME 3 n 1  Once     07/16/16 1816   07/16/16 1817  DME Walker rolling  Once     07/16/16 1816      Diagnostic Studies: Dg Hip Operative Unilat W Or W/o Pelvis Left  Result Date: 07/16/2016 CLINICAL DATA:  Left hip surgery. EXAM: OPERATIVE LEFT HIP (WITH PELVIS IF PERFORMED) 1 VIEW TECHNIQUE: Fluoroscopic spot image(s) were submitted for interpretation post-operatively. COMPARISON:  None. FINDINGS: A left hip arthroplasty has been placed. Left hip appears located on this single view. No evidence for a hardware complication. IMPRESSION: Intraoperative images of the left hip arthroplasty. Electronically Signed   By: Markus Daft M.D.   On: 07/16/2016 16:41   Dg Hip Unilat W Or W/o Pelvis 2-3 Views Left  Result Date: 07/16/2016 CLINICAL DATA:  LEFT hip replacement EXAM: DG HIP (WITH OR WITHOUT PELVIS) 2-3V LEFT  COMPARISON:  Intraoperative images 1114 2017 FINDINGS: LEFT hip prosthesis identified. No acute fracture, dislocation or bone destruction. Surgical drains and skin clips at the surgical site. IMPRESSION: LEFT hip prosthesis without acute complication. Electronically Signed   By: Lavonia Dana M.D.   On: 07/16/2016 17:23    Disposition: 01-Home or Self Care     Contact information for follow-up providers    MENZ,MICHAEL, MD Follow up in 2 week(s).   Specialty:  Orthopedic Surgery Contact information: [place]  [place] [address] [phone]            Contact information for after-discharge care    Destination    [facility] Follow up.   Specialty:  [place] information: [place] [phone]                   Signed: Feliberto Gottron 07/18/2016, 11:49 PM

## 2016-07-19 NOTE — Progress Notes (Addendum)
   Subjective: 3 Days Post-Op Procedure(s) (LRB): TOTAL HIP ARTHROPLASTY ANTERIOR APPROACH (Left) Patient reports pain as mild.   Patient is well, and has had no acute complaints or problems Denies any CP, SOB, ABD pain. We will continue therapy today.  Plan is to go Skilled nursing facility after hospital stay.  Objective: Vital signs in last 24 hours: Temp:  [97.9 F (36.6 C)-98.9 F (37.2 C)] 98.9 F (37.2 C) (11/17 0419) Pulse Rate:  [86-97] 86 (11/17 0419) Resp:  [18-19] 18 (11/17 0419) BP: (109-134)/(46-65) 128/53 (11/17 0419) SpO2:  [95 %-99 %] 96 % (11/17 0419)  Intake/Output from previous day: 11/16 0701 - 11/17 0700 In: 1440 [P.O.:1440] Out: 450 [Urine:450] Intake/Output this shift: No intake/output data recorded.   Recent Labs  07/16/16 1930 07/17/16 0459 07/18/16 0538  HGB 12.3 11.4* 11.1*    Recent Labs  07/17/16 0459 07/18/16 0538  WBC 7.9 9.1  RBC 3.56* 3.50*  HCT 33.7* 32.7*  PLT 204 171    Recent Labs  07/17/16 0459 07/18/16 0538  NA 136 135  K 4.0 4.1  CL 104 103  CO2 27 27  BUN 14 10  CREATININE 0.59 0.52  GLUCOSE 122* 109*  CALCIUM 8.5* 8.3*   No results for input(s): LABPT, INR in the last 72 hours.  EXAM General - Patient is Alert, Appropriate and Oriented Extremity - Neurovascular intact Sensation intact distally Intact pulses distally Dorsiflexion/Plantar flexion intact No cellulitis present Compartment soft Dressing - dressing C/D/I and no drainage. hemovac removed, wound vac intact Motor Function - intact, moving foot and toes well on exam.   Past Medical History:  Diagnosis Date  . Arthritis   . Cancer (HCC)    Basal Cell left cheek, left earlobe  . GERD (gastroesophageal reflux disease)   . HOH (hard of hearing)    hearing aid left ear, deaf in right ear    Assessment/Plan:   3 Days Post-Op Procedure(s) (LRB): TOTAL HIP ARTHROPLASTY ANTERIOR APPROACH (Left) Active Problems:   Primary localized  osteoarthritis of left hip  Estimated body mass index is 35.12 kg/m as calculated from the following:   Height as of this encounter: 5\' 2"  (1.575 m).   Weight as of this encounter: 87.1 kg (192 lb). Advance diet Up with therapy  Acute post op blood loss anemia - Hgb 11.1, stable CM to assist with discharge, SNF today Follow up with [facility] in 2 weeks     DVT Prophylaxis - Lovenox, Foot Pumps and TED hose Weight-Bearing as tolerated to left leg   T. Rachelle Hora, PA-C [place] 07/19/2016, 8:03 AM

## 2016-07-19 NOTE — Progress Notes (Signed)
Called report to [place] at [employer], answered all questions. Belongings packed. EMS called for transport.

## 2016-07-19 NOTE — Plan of Care (Signed)
Problem: Bowel/Gastric: Goal: Will not experience complications related to bowel motility Milk of Magnesia administered

## 2016-07-19 NOTE — Progress Notes (Signed)
Physical Therapy Treatment Patient Details Name: [name] MRN: [id number] DOB: 1940/10/30 Today's Date: 07/19/2016    History of Present Illness Pt is a 75 y/o F s/p L THA direct anterior approach.  Pt's PMH includes cancer (basal cell L cheek and earlobe), HOH.    PT Comments    Ms. Raymon made good progress today, ambulating 120 ft.  She does, however, require min guard assist for safety and cues for upright posture and step through gait pattern.  She tolerated all interventions well this date.  SNF remains most appropriate recommendation at d/c.  Follow Up Recommendations  SNF     Equipment Recommendations  None recommended by PT    Recommendations for Other Services       Precautions / Restrictions Precautions Precautions: Fall Precaution Comments: no hip precautions, direct anterior Restrictions Weight Bearing Restrictions: Yes LLE Weight Bearing: Weight bearing as tolerated    Mobility  Bed Mobility               General bed mobility comments: Pt sitting in recliner chair upon PT arrival  Transfers Overall transfer level: Needs assistance Equipment used: Rolling walker (2 wheeled) Transfers: Sit to/from Stand Sit to Stand: Min guard         General transfer comment: increased time and effort.  Ambulation/Gait Ambulation/Gait assistance: Min guard Ambulation Distance (Feet): 120 Feet Assistive device: Rolling walker (2 wheeled) Gait Pattern/deviations: Step-to pattern;Step-through pattern;Decreased stride length;Decreased stance time - left;Decreased step length - right;Decreased weight shift to left;Antalgic;Trunk flexed Gait velocity: decreased Gait velocity interpretation: Below normal speed for age/gender General Gait Details: Cues for relaxing shoulders and upright posture as well as step through gait pattern.   Stairs            Wheelchair Mobility    Modified Rankin (Stroke Patients Only)       Balance Overall balance  assessment: Needs assistance Sitting-balance support: No upper extremity supported;Feet supported Sitting balance-Leahy Scale: Good     Standing balance support: Bilateral upper extremity supported;During functional activity Standing balance-Leahy Scale: Fair Standing balance comment: Relies on RW for support                    Cognition Arousal/Alertness: Awake/alert Behavior During Therapy: WFL for tasks assessed/performed Overall Cognitive Status: Within Functional Limits for tasks assessed                      Exercises Total Joint Exercises Quad Sets: Strengthening;Both;10 reps;Seated;Other (comment) (with 3 second holds) Hip ABduction/ADduction: AROM;10 reps;Left;Seated Straight Leg Raises: Left;10 reps;AAROM;Seated Long Arc Quad: AROM;Left;10 reps;Seated Knee Flexion: AROM;Left;10 reps;Standing    General Comments        Pertinent Vitals/Pain Pain Assessment: Faces Faces Pain Scale: Hurts little more Pain Location: L thigh Pain Descriptors / Indicators: Tightness Pain Intervention(s): Limited activity within patient's tolerance;Monitored during session;Repositioned    Home Living                      Prior Function            PT Goals (current goals can now be found in the care plan section) Acute Rehab PT Goals Patient Stated Goal: to get back to going on walks with her husband PT Goal Formulation: With patient Time For Goal Achievement: 07/24/16 Potential to Achieve Goals: Good Progress towards PT goals: Progressing toward goals    Frequency    BID      PT Plan  Current plan remains appropriate    Co-evaluation             End of Session Equipment Utilized During Treatment: Gait belt Activity Tolerance: Patient tolerated treatment well;Patient limited by fatigue Patient left: with call bell/phone within reach;in chair;with chair alarm set     Time: [id number] PT Time Calculation (min) (ACUTE ONLY): 18  min  Charges:  $Gait Training: 8-22 mins                    G Codes:       Collie Siad PT, DPT 07/19/2016, 11:50 AM

## 2016-07-19 NOTE — Clinical Social Work Note (Signed)
Patient to be d/c'ed today to [facility].  Patient and family agreeable to plans will transport via ems RN to call report to [phone].  Evette Cristal, MSW Mon-Fri 8a-4:30p [phone]

## 2016-11-27 DIAGNOSIS — Z23 Encounter for immunization: Secondary | ICD-10-CM | POA: Diagnosis not present

## 2016-11-27 DIAGNOSIS — E785 Hyperlipidemia, unspecified: Secondary | ICD-10-CM | POA: Diagnosis not present

## 2016-11-27 DIAGNOSIS — Z0000 Encounter for general adult medical examination without abnormal findings: Secondary | ICD-10-CM | POA: Diagnosis not present

## 2016-11-27 DIAGNOSIS — N951 Menopausal and female climacteric states: Secondary | ICD-10-CM | POA: Diagnosis not present

## 2016-12-25 DIAGNOSIS — H01009 Unspecified blepharitis unspecified eye, unspecified eyelid: Secondary | ICD-10-CM | POA: Diagnosis not present

## 2017-01-26 DIAGNOSIS — S0502XA Injury of conjunctiva and corneal abrasion without foreign body, left eye, initial encounter: Secondary | ICD-10-CM | POA: Diagnosis not present

## 2017-01-28 DIAGNOSIS — S0502XA Injury of conjunctiva and corneal abrasion without foreign body, left eye, initial encounter: Secondary | ICD-10-CM | POA: Diagnosis not present

## 2017-01-28 DIAGNOSIS — S0502XD Injury of conjunctiva and corneal abrasion without foreign body, left eye, subsequent encounter: Secondary | ICD-10-CM | POA: Diagnosis not present

## 2017-02-12 DIAGNOSIS — H9042 Sensorineural hearing loss, unilateral, left ear, with unrestricted hearing on the contralateral side: Secondary | ICD-10-CM | POA: Diagnosis not present

## 2017-02-12 DIAGNOSIS — H95191 Other disorders following mastoidectomy, right ear: Secondary | ICD-10-CM | POA: Diagnosis not present

## 2017-02-28 DIAGNOSIS — E785 Hyperlipidemia, unspecified: Secondary | ICD-10-CM | POA: Diagnosis not present

## 2017-03-10 DIAGNOSIS — Z780 Asymptomatic menopausal state: Secondary | ICD-10-CM | POA: Diagnosis not present

## 2017-06-04 DIAGNOSIS — M67871 Other specified disorders of synovium, right ankle and foot: Secondary | ICD-10-CM | POA: Diagnosis not present

## 2017-06-04 DIAGNOSIS — M2032 Hallux varus (acquired), left foot: Secondary | ICD-10-CM | POA: Diagnosis not present

## 2017-06-04 DIAGNOSIS — M2042 Other hammer toe(s) (acquired), left foot: Secondary | ICD-10-CM | POA: Diagnosis not present

## 2017-06-04 DIAGNOSIS — M67872 Other specified disorders of synovium, left ankle and foot: Secondary | ICD-10-CM | POA: Diagnosis not present

## 2017-06-10 DIAGNOSIS — Z1231 Encounter for screening mammogram for malignant neoplasm of breast: Secondary | ICD-10-CM | POA: Diagnosis not present

## 2017-06-10 DIAGNOSIS — E785 Hyperlipidemia, unspecified: Secondary | ICD-10-CM | POA: Diagnosis not present

## 2017-06-24 DIAGNOSIS — M76821 Posterior tibial tendinitis, right leg: Secondary | ICD-10-CM | POA: Diagnosis not present

## 2017-06-24 DIAGNOSIS — M216X2 Other acquired deformities of left foot: Secondary | ICD-10-CM | POA: Diagnosis not present

## 2017-06-24 DIAGNOSIS — M216X1 Other acquired deformities of right foot: Secondary | ICD-10-CM | POA: Diagnosis not present

## 2017-06-24 DIAGNOSIS — M76822 Posterior tibial tendinitis, left leg: Secondary | ICD-10-CM | POA: Diagnosis not present

## 2017-06-26 DIAGNOSIS — M76822 Posterior tibial tendinitis, left leg: Secondary | ICD-10-CM | POA: Diagnosis not present

## 2017-06-26 DIAGNOSIS — M216X1 Other acquired deformities of right foot: Secondary | ICD-10-CM | POA: Diagnosis not present

## 2017-06-26 DIAGNOSIS — M76821 Posterior tibial tendinitis, right leg: Secondary | ICD-10-CM | POA: Diagnosis not present

## 2017-06-26 DIAGNOSIS — M216X2 Other acquired deformities of left foot: Secondary | ICD-10-CM | POA: Diagnosis not present

## 2017-07-01 DIAGNOSIS — M76821 Posterior tibial tendinitis, right leg: Secondary | ICD-10-CM | POA: Diagnosis not present

## 2017-07-01 DIAGNOSIS — M216X2 Other acquired deformities of left foot: Secondary | ICD-10-CM | POA: Diagnosis not present

## 2017-07-01 DIAGNOSIS — M76822 Posterior tibial tendinitis, left leg: Secondary | ICD-10-CM | POA: Diagnosis not present

## 2017-07-01 DIAGNOSIS — M216X1 Other acquired deformities of right foot: Secondary | ICD-10-CM | POA: Diagnosis not present

## 2017-07-03 DIAGNOSIS — M216X2 Other acquired deformities of left foot: Secondary | ICD-10-CM | POA: Diagnosis not present

## 2017-07-03 DIAGNOSIS — M76822 Posterior tibial tendinitis, left leg: Secondary | ICD-10-CM | POA: Diagnosis not present

## 2017-07-03 DIAGNOSIS — M76821 Posterior tibial tendinitis, right leg: Secondary | ICD-10-CM | POA: Diagnosis not present

## 2017-07-03 DIAGNOSIS — M216X1 Other acquired deformities of right foot: Secondary | ICD-10-CM | POA: Diagnosis not present

## 2017-07-08 DIAGNOSIS — M216X1 Other acquired deformities of right foot: Secondary | ICD-10-CM | POA: Diagnosis not present

## 2017-07-08 DIAGNOSIS — M76821 Posterior tibial tendinitis, right leg: Secondary | ICD-10-CM | POA: Diagnosis not present

## 2017-07-08 DIAGNOSIS — M76822 Posterior tibial tendinitis, left leg: Secondary | ICD-10-CM | POA: Diagnosis not present

## 2017-07-08 DIAGNOSIS — M216X2 Other acquired deformities of left foot: Secondary | ICD-10-CM | POA: Diagnosis not present

## 2017-07-10 DIAGNOSIS — M216X2 Other acquired deformities of left foot: Secondary | ICD-10-CM | POA: Diagnosis not present

## 2017-07-10 DIAGNOSIS — M76822 Posterior tibial tendinitis, left leg: Secondary | ICD-10-CM | POA: Diagnosis not present

## 2017-07-10 DIAGNOSIS — M216X1 Other acquired deformities of right foot: Secondary | ICD-10-CM | POA: Diagnosis not present

## 2017-07-10 DIAGNOSIS — M76821 Posterior tibial tendinitis, right leg: Secondary | ICD-10-CM | POA: Diagnosis not present

## 2017-07-15 DIAGNOSIS — M76822 Posterior tibial tendinitis, left leg: Secondary | ICD-10-CM | POA: Diagnosis not present

## 2017-07-15 DIAGNOSIS — M216X1 Other acquired deformities of right foot: Secondary | ICD-10-CM | POA: Diagnosis not present

## 2017-07-15 DIAGNOSIS — M76821 Posterior tibial tendinitis, right leg: Secondary | ICD-10-CM | POA: Diagnosis not present

## 2017-07-15 DIAGNOSIS — M216X2 Other acquired deformities of left foot: Secondary | ICD-10-CM | POA: Diagnosis not present

## 2017-07-17 DIAGNOSIS — M76821 Posterior tibial tendinitis, right leg: Secondary | ICD-10-CM | POA: Diagnosis not present

## 2017-07-17 DIAGNOSIS — M76822 Posterior tibial tendinitis, left leg: Secondary | ICD-10-CM | POA: Diagnosis not present

## 2017-07-17 DIAGNOSIS — M216X1 Other acquired deformities of right foot: Secondary | ICD-10-CM | POA: Diagnosis not present

## 2017-07-17 DIAGNOSIS — M216X2 Other acquired deformities of left foot: Secondary | ICD-10-CM | POA: Diagnosis not present

## 2017-07-22 DIAGNOSIS — M76821 Posterior tibial tendinitis, right leg: Secondary | ICD-10-CM | POA: Diagnosis not present

## 2017-07-22 DIAGNOSIS — M216X2 Other acquired deformities of left foot: Secondary | ICD-10-CM | POA: Diagnosis not present

## 2017-07-22 DIAGNOSIS — M216X1 Other acquired deformities of right foot: Secondary | ICD-10-CM | POA: Diagnosis not present

## 2017-07-22 DIAGNOSIS — M76822 Posterior tibial tendinitis, left leg: Secondary | ICD-10-CM | POA: Diagnosis not present

## 2017-07-28 DIAGNOSIS — H40003 Preglaucoma, unspecified, bilateral: Secondary | ICD-10-CM | POA: Diagnosis not present

## 2017-08-27 DIAGNOSIS — M2042 Other hammer toe(s) (acquired), left foot: Secondary | ICD-10-CM | POA: Diagnosis not present

## 2017-08-27 DIAGNOSIS — M2032 Hallux varus (acquired), left foot: Secondary | ICD-10-CM | POA: Diagnosis not present

## 2017-08-27 DIAGNOSIS — M67872 Other specified disorders of synovium, left ankle and foot: Secondary | ICD-10-CM | POA: Diagnosis not present

## 2017-09-04 DIAGNOSIS — H401131 Primary open-angle glaucoma, bilateral, mild stage: Secondary | ICD-10-CM | POA: Diagnosis not present

## 2017-10-16 DIAGNOSIS — H401111 Primary open-angle glaucoma, right eye, mild stage: Secondary | ICD-10-CM | POA: Diagnosis not present

## 2017-12-01 DIAGNOSIS — D329 Benign neoplasm of meninges, unspecified: Secondary | ICD-10-CM | POA: Diagnosis not present

## 2017-12-01 DIAGNOSIS — Z0000 Encounter for general adult medical examination without abnormal findings: Secondary | ICD-10-CM | POA: Diagnosis not present

## 2017-12-01 DIAGNOSIS — Z136 Encounter for screening for cardiovascular disorders: Secondary | ICD-10-CM | POA: Diagnosis not present

## 2017-12-01 DIAGNOSIS — E785 Hyperlipidemia, unspecified: Secondary | ICD-10-CM | POA: Diagnosis not present

## 2018-02-12 DIAGNOSIS — H401131 Primary open-angle glaucoma, bilateral, mild stage: Secondary | ICD-10-CM | POA: Diagnosis not present

## 2018-03-09 DIAGNOSIS — H9042 Sensorineural hearing loss, unilateral, left ear, with unrestricted hearing on the contralateral side: Secondary | ICD-10-CM | POA: Diagnosis not present

## 2018-03-09 DIAGNOSIS — H6981 Other specified disorders of Eustachian tube, right ear: Secondary | ICD-10-CM | POA: Diagnosis not present

## 2018-03-09 DIAGNOSIS — H95191 Other disorders following mastoidectomy, right ear: Secondary | ICD-10-CM | POA: Diagnosis not present

## 2018-03-27 DIAGNOSIS — E785 Hyperlipidemia, unspecified: Secondary | ICD-10-CM | POA: Diagnosis not present

## 2018-03-30 DIAGNOSIS — L720 Epidermal cyst: Secondary | ICD-10-CM | POA: Diagnosis not present

## 2018-03-30 DIAGNOSIS — L570 Actinic keratosis: Secondary | ICD-10-CM | POA: Diagnosis not present

## 2018-03-30 DIAGNOSIS — L304 Erythema intertrigo: Secondary | ICD-10-CM | POA: Diagnosis not present

## 2018-03-30 DIAGNOSIS — L821 Other seborrheic keratosis: Secondary | ICD-10-CM | POA: Diagnosis not present

## 2018-03-30 DIAGNOSIS — D225 Melanocytic nevi of trunk: Secondary | ICD-10-CM | POA: Diagnosis not present

## 2018-03-30 DIAGNOSIS — L814 Other melanin hyperpigmentation: Secondary | ICD-10-CM | POA: Diagnosis not present

## 2018-03-30 DIAGNOSIS — Z85828 Personal history of other malignant neoplasm of skin: Secondary | ICD-10-CM | POA: Diagnosis not present

## 2018-04-10 DIAGNOSIS — H2511 Age-related nuclear cataract, right eye: Secondary | ICD-10-CM | POA: Diagnosis not present

## 2018-04-16 ENCOUNTER — Encounter: Payer: Self-pay | Admitting: *Deleted

## 2018-04-16 ENCOUNTER — Other Ambulatory Visit: Payer: Self-pay

## 2018-04-23 NOTE — Discharge Instructions (Signed)

## 2018-04-28 ENCOUNTER — Ambulatory Visit: Payer: Medicare Other | Admitting: Anesthesiology

## 2018-04-28 ENCOUNTER — Ambulatory Visit
Admission: RE | Admit: 2018-04-28 | Discharge: 2018-04-28 | Disposition: A | Discharge status: Home / Self Care | Payer: Medicare Other | Source: Ambulatory Visit | Attending: Ophthalmology | Admitting: Ophthalmology

## 2018-04-28 ENCOUNTER — Encounter
Admission: RE | Disposition: A | Discharge status: Home / Self Care | Payer: Self-pay | Source: Ambulatory Visit | Attending: Ophthalmology

## 2018-04-28 DIAGNOSIS — Z96611 Presence of right artificial shoulder joint: Secondary | ICD-10-CM | POA: Insufficient documentation

## 2018-04-28 DIAGNOSIS — Z79899 Other long term (current) drug therapy: Secondary | ICD-10-CM | POA: Diagnosis not present

## 2018-04-28 DIAGNOSIS — Z7982 Long term (current) use of aspirin: Secondary | ICD-10-CM | POA: Diagnosis not present

## 2018-04-28 DIAGNOSIS — Z96653 Presence of artificial knee joint, bilateral: Secondary | ICD-10-CM | POA: Insufficient documentation

## 2018-04-28 DIAGNOSIS — H2511 Age-related nuclear cataract, right eye: Secondary | ICD-10-CM | POA: Diagnosis not present

## 2018-04-28 DIAGNOSIS — Z96642 Presence of left artificial hip joint: Secondary | ICD-10-CM | POA: Diagnosis not present

## 2018-04-28 DIAGNOSIS — Z85828 Personal history of other malignant neoplasm of skin: Secondary | ICD-10-CM | POA: Diagnosis not present

## 2018-04-28 DIAGNOSIS — H25811 Combined forms of age-related cataract, right eye: Secondary | ICD-10-CM | POA: Diagnosis not present

## 2018-04-28 HISTORY — PX: CATARACT EXTRACTION W/PHACO: SHX586

## 2018-04-28 HISTORY — DX: Presence of external hearing-aid: Z97.4

## 2018-04-28 SURGERY — PHACOEMULSIFICATION, CATARACT, WITH IOL INSERTION
Anesthesia: Monitor Anesthesia Care | Site: Eye | Laterality: Right | Wound class: Clean

## 2018-04-28 MED ORDER — MIDAZOLAM HCL 2 MG/2ML IJ SOLN
INTRAMUSCULAR | Status: DC | PRN
  Administered 2018-04-28: 2 mg via INTRAVENOUS

## 2018-04-28 MED ORDER — FENTANYL CITRATE (PF) 100 MCG/2ML IJ SOLN
INTRAMUSCULAR | Status: DC | PRN
  Administered 2018-04-28: 50 ug via INTRAVENOUS

## 2018-04-28 MED ORDER — BRIMONIDINE TARTRATE-TIMOLOL 0.2-0.5 % OP SOLN
OPHTHALMIC | Status: DC | PRN
  Administered 2018-04-28: 1 [drp] via OPHTHALMIC

## 2018-04-28 MED ORDER — NA HYALUR & NA CHOND-NA HYALUR 0.4-0.35 ML IO KIT
PACK | INTRAOCULAR | Status: DC | PRN
  Administered 2018-04-28: 1 mL via INTRAOCULAR

## 2018-04-28 MED ORDER — MOXIFLOXACIN HCL 0.5 % OP SOLN
1.0000 [drp] | OPHTHALMIC | Status: DC | PRN
  Administered 2018-04-28 (×3): 1 [drp] via OPHTHALMIC

## 2018-04-28 MED ORDER — ACETAMINOPHEN 325 MG PO TABS: 325.0000 mg | ORAL_TABLET | ORAL | Status: DC | PRN

## 2018-04-28 MED ORDER — LIDOCAINE HCL (PF) 2 % IJ SOLN
INTRAOCULAR | Status: DC | PRN
  Administered 2018-04-28: 1 mL

## 2018-04-28 MED ORDER — CEFUROXIME OPHTHALMIC INJECTION 1 MG/0.1 ML
INJECTION | OPHTHALMIC | Status: DC | PRN
  Administered 2018-04-28: 0.1 mL via OPHTHALMIC

## 2018-04-28 MED ORDER — EPINEPHRINE PF 1 MG/ML IJ SOLN
INTRAOCULAR | Status: DC | PRN
  Administered 2018-04-28: 82 mL via OPHTHALMIC

## 2018-04-28 MED ORDER — ACETAMINOPHEN 160 MG/5ML PO SOLN: 325.0000 mg | ORAL | Status: DC | PRN

## 2018-04-28 MED ORDER — ARMC OPHTHALMIC DILATING DROPS
1.0000 "application " | OPHTHALMIC | Status: DC | PRN
  Administered 2018-04-28 (×3): 1 via OPHTHALMIC

## 2018-04-28 SURGICAL SUPPLY — 29 items
CANNULA ANT/CHMB 27G (MISCELLANEOUS) ×1 IMPLANT
CANNULA ANT/CHMB 27GA (MISCELLANEOUS) ×3 IMPLANT
CARTRIDGE ABBOTT (MISCELLANEOUS) IMPLANT
GLOVE SURG LX 7.5 STRW (GLOVE) ×2
GLOVE SURG LX STRL 7.5 STRW (GLOVE) ×1 IMPLANT
GLOVE SURG TRIUMPH 8.0 PF LTX (GLOVE) ×3 IMPLANT
GOWN STRL REUS W/ TWL LRG LVL3 (GOWN DISPOSABLE) ×2 IMPLANT
GOWN STRL REUS W/TWL LRG LVL3 (GOWN DISPOSABLE) ×6
LENS IOL ACRSF IQ TRC 8 20.5 IMPLANT
LENS IOL ACRYSOF IQ TORIC 20.5 ×3 IMPLANT
LENS IOL IQ TORIC 8 20.5 ×1 IMPLANT
MARKER SKIN DUAL TIP RULER LAB (MISCELLANEOUS) ×3 IMPLANT
NDL FILTER BLUNT 18X1 1/2 (NEEDLE) ×1 IMPLANT
NDL RETROBULBAR .5 NSTRL (NEEDLE) IMPLANT
NEEDLE FILTER BLUNT 18X 1/2SAF (NEEDLE) ×2
NEEDLE FILTER BLUNT 18X1 1/2 (NEEDLE) ×1 IMPLANT
PACK CATARACT BRASINGTON (MISCELLANEOUS) ×3 IMPLANT
PACK EYE AFTER SURG (MISCELLANEOUS) ×3 IMPLANT
PACK OPTHALMIC (MISCELLANEOUS) ×3 IMPLANT
RING MALYGIN 7.0 (MISCELLANEOUS) IMPLANT
SUT ETHILON 10-0 CS-B-6CS-B-6 (SUTURE)
SUT VICRYL  9 0 (SUTURE)
SUT VICRYL 9 0 (SUTURE) IMPLANT
SUTURE EHLN 10-0 CS-B-6CS-B-6 (SUTURE) IMPLANT
SYR 3ML LL SCALE MARK (SYRINGE) ×3 IMPLANT
SYR 5ML LL (SYRINGE) ×3 IMPLANT
SYR TB 1ML LUER SLIP (SYRINGE) ×3 IMPLANT
WATER STERILE IRR 500ML POUR (IV SOLUTION) ×3 IMPLANT
WIPE NON LINTING 3.25X3.25 (MISCELLANEOUS) ×3 IMPLANT

## 2018-04-28 NOTE — Anesthesia Postprocedure Evaluation (Signed)
Anesthesia Post Note  Patient: [name]  Procedure(s) Performed: CATARACT EXTRACTION PHACO AND INTRAOCULAR LENS PLACEMENT (IOC) RIGHT IVA TOPICAL  toric lens (Right Eye)  Patient location during evaluation: PACU Anesthesia Type: MAC Level of consciousness: awake and alert Pain management: pain level controlled Vital Signs Assessment: post-procedure vital signs reviewed and stable Respiratory status: spontaneous breathing, nonlabored ventilation, respiratory function stable and patient connected to nasal cannula oxygen Cardiovascular status: stable and blood pressure returned to baseline Postop Assessment: no apparent nausea or vomiting Anesthetic complications: no    Alisa Graff

## 2018-04-28 NOTE — Transfer of Care (Signed)
Immediate Anesthesia Transfer of Care Note  Patient: [name]  Procedure(s) Performed: CATARACT EXTRACTION PHACO AND INTRAOCULAR LENS PLACEMENT (IOC) RIGHT IVA TOPICAL  toric lens (Right Eye)  Patient Location: PACU  Anesthesia Type: MAC  Level of Consciousness: awake, alert  and patient cooperative  Airway and Oxygen Therapy: Patient Spontanous Breathing and Patient connected to supplemental oxygen  Post-op Assessment: Post-op Vital signs reviewed, Patient's Cardiovascular Status Stable, Respiratory Function Stable, Patent Airway and No signs of Nausea or vomiting  Post-op Vital Signs: Reviewed and stable  Complications: No apparent anesthesia complications

## 2018-04-28 NOTE — Anesthesia Procedure Notes (Signed)
Procedure Name: MAC Date/Time: 04/28/2018 9:39 AM Performed by: Janna Arch, CRNA Pre-anesthesia Checklist: Patient identified, Emergency Drugs available, Suction available and Patient being monitored Patient Re-evaluated:Patient Re-evaluated prior to induction Oxygen Delivery Method: Nasal cannula

## 2018-04-28 NOTE — Anesthesia Preprocedure Evaluation (Signed)
Anesthesia Evaluation  Patient identified by MRN, date of birth, ID band Patient awake    Reviewed: Allergy & Precautions, H&P , NPO status , Patient's Chart, lab work & pertinent test results, reviewed documented beta blocker date and time   Airway Mallampati: II  TM Distance: >3 FB Neck ROM: full    Dental no notable dental hx.    Pulmonary neg pulmonary ROS,    Pulmonary exam normal breath sounds clear to auscultation       Cardiovascular Exercise Tolerance: Good negative cardio ROS   Rhythm:regular Rate:Normal     Neuro/Psych negative neurological ROS  negative psych ROS   GI/Hepatic Neg liver ROS, GERD  ,  Endo/Other  negative endocrine ROS  Renal/GU negative Renal ROS  negative genitourinary   Musculoskeletal   Abdominal   Peds  Hematology negative hematology ROS (+)   Anesthesia Other Findings   Reproductive/Obstetrics negative OB ROS                             Anesthesia Physical Anesthesia Plan  ASA: II  Anesthesia Plan: MAC   Post-op Pain Management:    Induction:   PONV Risk Score and Plan:   Airway Management Planned:   Additional Equipment:   Intra-op Plan:   Post-operative Plan:   Informed Consent: I have reviewed the patients History and Physical, chart, labs and discussed the procedure including the risks, benefits and alternatives for the proposed anesthesia with the patient or authorized representative who has indicated his/her understanding and acceptance.   Dental Advisory Given  Plan Discussed with: CRNA  Anesthesia Plan Comments:         Anesthesia Quick Evaluation

## 2018-04-28 NOTE — Op Note (Signed)
LOCATION:  [facility]   PREOPERATIVE DIAGNOSIS:  Nuclear sclerotic cataract of the right eye.  H25.11   POSTOPERATIVE DIAGNOSIS:  Nuclear sclerotic cataract of the right eye.   PROCEDURE:  Phacoemulsification with Toric posterior chamber intraocular lens placement of the right eye.   LENS:   Implant Name Type Inv. Item Serial No. Manufacturer Lot No. LRB No. Used  LENS IOL TORIC 20.5 - [id number]  LENS IOL TORIC 20.5 [id number] ALCON  Right 1     [id number] 17.5 D Toric intraocular lens with 5.25 diopters of cylindrical power with axis orientation at 22 degrees.   ULTRASOUND TIME: 17 % of 1 minutes, 37 seconds.  CDE 17.1   SURGEON:  Wyonia Hough, MD   ANESTHESIA: Topical with tetracaine drops and 2% Xylocaine jelly, augmented with 1% preservative-free intracameral lidocaine. .   COMPLICATIONS:  None.   DESCRIPTION OF PROCEDURE:  The patient was identified in the holding room and transported to the operating suite and placed in the supine position under the operating microscope.  The right eye was identified as the operative eye, and it was prepped and draped in the usual sterile ophthalmic fashion.    A clear-corneal paracentesis incision was made at the 12:00 position.  0.5 ml of preservative-free 1% lidocaine was injected into the anterior chamber. The anterior chamber was filled with Viscoat.  A 2.4 millimeter near clear corneal incision was then made at the 9:00 position.  A cystotome and capsulorrhexis forceps were then used to make a curvilinear capsulorrhexis.  Hydrodissection and hydrodelineation were then performed using balanced salt solution.   Phacoemulsification was then used in stop and chop fashion to remove the lens, nucleus and epinucleus.  The remaining cortex was aspirated using the irrigation and aspiration handpiece.  Provisc viscoelastic was then placed into the capsular bag to distend it for lens placement.  The Verion digital marker was used to  align the implant at the intended axis.   A Toric lens was then injected into the capsular bag.  It was rotated clockwise until the axis marks on the lens were approximately 15 degrees in the counterclockwise direction to the intended alignment.  The viscoelastic was aspirated from the eye using the irrigation aspiration handpiece.  Then, a Koch spatula through the sideport incision was used to rotate the lens in a clockwise direction until the axis markings of the intraocular lens were lined up with the Verion alignment.  Balanced salt solution was then used to hydrate the wounds. Cefuroxime 0.1 ml of a 10mg /ml solution was injected into the anterior chamber for a dose of 1 mg of intracameral antibiotic at the completion of the case.    The eye was noted to have a physiologic pressure and there was no wound leak noted.   Timolol and Brimonidine drops were applied to the eye.  The patient was taken to the recovery room in stable condition having had no complications of anesthesia or surgery.  Sophia Sperry 04/28/2018, 10:05 AM

## 2018-04-28 NOTE — H&P (Signed)
The History and Physical notes are on paper, have been signed, and are to be scanned. The patient remains stable and unchanged from the H&P.   Previous H&P reviewed, patient examined, and there are no changes.  [name] 04/28/2018 8:40 AM

## 2018-04-29 ENCOUNTER — Encounter: Payer: Self-pay | Admitting: Ophthalmology

## 2018-05-15 DIAGNOSIS — H2512 Age-related nuclear cataract, left eye: Secondary | ICD-10-CM | POA: Diagnosis not present

## 2018-05-20 ENCOUNTER — Encounter: Payer: Self-pay | Admitting: *Deleted

## 2018-05-20 ENCOUNTER — Other Ambulatory Visit: Payer: Self-pay

## 2018-05-21 NOTE — Discharge Instructions (Signed)

## 2018-05-26 ENCOUNTER — Ambulatory Visit: Payer: Medicare Other | Admitting: Anesthesiology

## 2018-05-26 ENCOUNTER — Ambulatory Visit
Admission: RE | Admit: 2018-05-26 | Discharge: 2018-05-26 | Disposition: A | Discharge status: Home / Self Care | Payer: Medicare Other | Source: Ambulatory Visit | Attending: Ophthalmology | Admitting: Ophthalmology

## 2018-05-26 ENCOUNTER — Encounter
Admission: RE | Disposition: A | Discharge status: Home / Self Care | Payer: Self-pay | Source: Ambulatory Visit | Attending: Ophthalmology

## 2018-05-26 DIAGNOSIS — Z96653 Presence of artificial knee joint, bilateral: Secondary | ICD-10-CM | POA: Diagnosis not present

## 2018-05-26 DIAGNOSIS — Z79899 Other long term (current) drug therapy: Secondary | ICD-10-CM | POA: Diagnosis not present

## 2018-05-26 DIAGNOSIS — H2512 Age-related nuclear cataract, left eye: Secondary | ICD-10-CM | POA: Diagnosis not present

## 2018-05-26 DIAGNOSIS — H25812 Combined forms of age-related cataract, left eye: Secondary | ICD-10-CM | POA: Diagnosis not present

## 2018-05-26 DIAGNOSIS — Z96611 Presence of right artificial shoulder joint: Secondary | ICD-10-CM | POA: Diagnosis not present

## 2018-05-26 DIAGNOSIS — Z7982 Long term (current) use of aspirin: Secondary | ICD-10-CM | POA: Insufficient documentation

## 2018-05-26 DIAGNOSIS — Z85828 Personal history of other malignant neoplasm of skin: Secondary | ICD-10-CM | POA: Insufficient documentation

## 2018-05-26 DIAGNOSIS — Z96642 Presence of left artificial hip joint: Secondary | ICD-10-CM | POA: Insufficient documentation

## 2018-05-26 HISTORY — PX: CATARACT EXTRACTION W/PHACO: SHX586

## 2018-05-26 SURGERY — PHACOEMULSIFICATION, CATARACT, WITH IOL INSERTION
Anesthesia: Monitor Anesthesia Care | Site: Eye | Laterality: Left

## 2018-05-26 MED ORDER — FENTANYL CITRATE (PF) 100 MCG/2ML IJ SOLN
INTRAMUSCULAR | Status: DC | PRN
  Administered 2018-05-26: 50 ug via INTRAVENOUS

## 2018-05-26 MED ORDER — NA HYALUR & NA CHOND-NA HYALUR 0.4-0.35 ML IO KIT
PACK | INTRAOCULAR | Status: DC | PRN
  Administered 2018-05-26: 1 mL via INTRAOCULAR

## 2018-05-26 MED ORDER — ARMC OPHTHALMIC DILATING DROPS
1.0000 "application " | OPHTHALMIC | Status: DC | PRN
  Administered 2018-05-26 (×3): 1 via OPHTHALMIC

## 2018-05-26 MED ORDER — MIDAZOLAM HCL 2 MG/2ML IJ SOLN
INTRAMUSCULAR | Status: DC | PRN
  Administered 2018-05-26 (×2): 1 mg via INTRAVENOUS

## 2018-05-26 MED ORDER — LIDOCAINE HCL (PF) 2 % IJ SOLN
INTRAOCULAR | Status: DC | PRN
  Administered 2018-05-26: 1 mL

## 2018-05-26 MED ORDER — LACTATED RINGERS IV SOLN: 10.0000 mL/h | INTRAVENOUS | Status: DC

## 2018-05-26 MED ORDER — EPINEPHRINE PF 1 MG/ML IJ SOLN
INTRAOCULAR | Status: DC | PRN
  Administered 2018-05-26: 50 mL via OPHTHALMIC

## 2018-05-26 MED ORDER — ONDANSETRON HCL 4 MG/2ML IJ SOLN: 4.0000 mg | Freq: Once | INTRAMUSCULAR | Status: DC | PRN

## 2018-05-26 MED ORDER — CEFUROXIME OPHTHALMIC INJECTION 1 MG/0.1 ML
INJECTION | OPHTHALMIC | Status: DC | PRN
  Administered 2018-05-26: 0.1 mL via INTRACAMERAL

## 2018-05-26 MED ORDER — BRIMONIDINE TARTRATE-TIMOLOL 0.2-0.5 % OP SOLN
OPHTHALMIC | Status: DC | PRN
  Administered 2018-05-26: 1 [drp] via OPHTHALMIC

## 2018-05-26 MED ORDER — MOXIFLOXACIN HCL 0.5 % OP SOLN
1.0000 [drp] | OPHTHALMIC | Status: DC | PRN
  Administered 2018-05-26 (×3): 1 [drp] via OPHTHALMIC

## 2018-05-26 MED ORDER — TETRACAINE HCL 0.5 % OP SOLN
1.0000 [drp] | OPHTHALMIC | Status: DC | PRN
  Administered 2018-05-26 (×2): 1 [drp] via OPHTHALMIC

## 2018-05-26 SURGICAL SUPPLY — 20 items
Acrysof IQ TORIC Astigmatism IOL (Intraocular Lens) ×2 IMPLANT
CANNULA ANT/CHMB 27G (MISCELLANEOUS) ×1 IMPLANT
CANNULA ANT/CHMB 27GA (MISCELLANEOUS) ×3 IMPLANT
GLOVE SURG LX 7.5 STRW (GLOVE) ×2
GLOVE SURG LX STRL 7.5 STRW (GLOVE) ×1 IMPLANT
GLOVE SURG TRIUMPH 8.0 PF LTX (GLOVE) ×3 IMPLANT
GOWN STRL REUS W/ TWL LRG LVL3 (GOWN DISPOSABLE) ×2 IMPLANT
GOWN STRL REUS W/TWL LRG LVL3 (GOWN DISPOSABLE) ×6
MARKER SKIN DUAL TIP RULER LAB (MISCELLANEOUS) ×3 IMPLANT
NDL FILTER BLUNT 18X1 1/2 (NEEDLE) ×1 IMPLANT
NEEDLE FILTER BLUNT 18X 1/2SAF (NEEDLE) ×2
NEEDLE FILTER BLUNT 18X1 1/2 (NEEDLE) ×1 IMPLANT
PACK CATARACT BRASINGTON (MISCELLANEOUS) ×3 IMPLANT
PACK EYE AFTER SURG (MISCELLANEOUS) ×3 IMPLANT
PACK OPTHALMIC (MISCELLANEOUS) ×3 IMPLANT
SYR 3ML LL SCALE MARK (SYRINGE) ×3 IMPLANT
SYR 5ML LL (SYRINGE) ×3 IMPLANT
SYR TB 1ML LUER SLIP (SYRINGE) ×3 IMPLANT
WATER STERILE IRR 500ML POUR (IV SOLUTION) ×3 IMPLANT
WIPE NON LINTING 3.25X3.25 (MISCELLANEOUS) ×3 IMPLANT

## 2018-05-26 NOTE — Anesthesia Preprocedure Evaluation (Signed)
Anesthesia Evaluation  Patient identified by MRN, date of birth, ID band Patient awake    Reviewed: Allergy & Precautions, H&P , NPO status , Patient's Chart, lab work & pertinent test results, reviewed documented beta blocker date and time   Airway Mallampati: II  TM Distance: >3 FB Neck ROM: full    Dental no notable dental hx.    Pulmonary neg pulmonary ROS,    breath sounds clear to auscultation       Cardiovascular Exercise Tolerance: Good negative cardio ROS   Rhythm:Regular Rate:Normal     Neuro/Psych negative psych ROS   GI/Hepatic Neg liver ROS, GERD  ,  Endo/Other  BMI 42  Renal/GU negative Renal ROS     Musculoskeletal  (+) Arthritis ,   Abdominal   Peds  Hematology negative hematology ROS (+)   Anesthesia Other Findings   Reproductive/Obstetrics negative OB ROS                             Anesthesia Physical  Anesthesia Plan  ASA: II  Anesthesia Plan: MAC   Post-op Pain Management:    Induction:   PONV Risk Score and Plan:   Airway Management Planned: Nasal Cannula and Natural Airway  Additional Equipment:   Intra-op Plan:   Post-operative Plan:   Informed Consent: I have reviewed the patients History and Physical, chart, labs and discussed the procedure including the risks, benefits and alternatives for the proposed anesthesia with the patient or authorized representative who has indicated his/her understanding and acceptance.     Plan Discussed with: CRNA  Anesthesia Plan Comments:         Anesthesia Quick Evaluation

## 2018-05-26 NOTE — Transfer of Care (Signed)
Immediate Anesthesia Transfer of Care Note  Patient: [name]  Procedure(s) Performed: CATARACT EXTRACTION PHACO AND INTRAOCULAR LENS PLACEMENT (IOC) LEFT TORIC LENS (Left Eye)  Patient Location: PACU  Anesthesia Type: MAC  Level of Consciousness: awake, alert  and patient cooperative  Airway and Oxygen Therapy: Patient Spontanous Breathing and Patient connected to supplemental oxygen  Post-op Assessment: Post-op Vital signs reviewed, Patient's Cardiovascular Status Stable, Respiratory Function Stable, Patent Airway and No signs of Nausea or vomiting  Post-op Vital Signs: Reviewed and stable  Complications: No apparent anesthesia complications

## 2018-05-26 NOTE — Op Note (Signed)
LOCATION:  [place]   PREOPERATIVE DIAGNOSIS:  Nuclear sclerotic cataract of the left eye.  H25.12  POSTOPERATIVE DIAGNOSIS:  Nuclear sclerotic cataract of the left eye.   PROCEDURE:  Phacoemulsification with Toric posterior chamber intraocular lens placement of the left eye.   LENS:  Implant Name Type Inv. Item Serial No. Manufacturer Lot No. LRB No. Used  Acrysof IQ TORIC Astigmatism IOL Intraocular Lens  [id number] ALCON  Left 1   [id number] 18.5 D Toric intraocular lens with 4.5 diopters of cylindrical power with axis orientation at 180 degrees.   ULTRASOUND TIME: 19 % of 1 minutes, 5 seconds.  CDE 12.2   SURGEON:  Wyonia Hough, MD   ANESTHESIA:  Topical with tetracaine drops and 2% Xylocaine jelly, augmented with 1% preservative-free intracameral lidocaine.  COMPLICATIONS:  None.   DESCRIPTION OF PROCEDURE:  The patient was identified in the holding room and transported to the operating suite and placed in the supine position under the operating microscope.  The left eye was identified as the operative eye, and it was prepped and draped in the usual sterile ophthalmic fashion.    A clear-corneal paracentesis incision was made at the 1:30 position.  0.5 ml of preservative-free 1% lidocaine was injected into the anterior chamber. The anterior chamber was filled with Viscoat.  A 2.4 millimeter near clear corneal incision was then made at the 10:30 position.  A cystotome and capsulorrhexis forceps were then used to make a curvilinear capsulorrhexis.  Hydrodissection and hydrodelineation were then performed using balanced salt solution.   Phacoemulsification was then used in stop and chop fashion to remove the lens, nucleus and epinucleus.  The remaining cortex was aspirated using the irrigation and aspiration handpiece.  Provisc viscoelastic was then placed into the capsular bag to distend it for lens placement.  The Verion digital marker was used to align the implant  at the intended axis.   A 18.5 diopter lens was then injected into the capsular bag.  It was rotated clockwise until the axis marks on the lens were approximately 15 degrees in the counterclockwise direction to the intended alignment.  The viscoelastic was aspirated from the eye using the irrigation aspiration handpiece.  Then, a Koch spatula through the sideport incision was used to rotate the lens in a clockwise direction until the axis markings of the intraocular lens were lined up with the Verion alignment.  Balanced salt solution was then used to hydrate the wounds. Cefuroxime 0.1 ml of a 10mg /ml solution was injected into the anterior chamber for a dose of 1 mg of intracameral antibiotic at the completion of the case.    The eye was noted to have a physiologic pressure and there was no wound leak noted.   Timolol and Brimonidine drops were applied to the eye.  The patient was taken to the recovery room in stable condition having had no complications of anesthesia or surgery.  Chuckie Mccathern 05/26/2018, 9:05 AM

## 2018-05-26 NOTE — H&P (Signed)
The History and Physical notes are on paper, have been signed, and are to be scanned. The patient remains stable and unchanged from the H&P.   Previous H&P reviewed, patient examined, and there are no changes.  Kameron Glazebrook 05/26/2018 8:12 AM

## 2018-05-26 NOTE — Anesthesia Postprocedure Evaluation (Signed)
Anesthesia Post Note  Patient: [name]  Procedure(s) Performed: CATARACT EXTRACTION PHACO AND INTRAOCULAR LENS PLACEMENT (IOC) LEFT TORIC LENS (Left Eye)  Patient location during evaluation: PACU Anesthesia Type: MAC Level of consciousness: awake and alert Pain management: pain level controlled Vital Signs Assessment: post-procedure vital signs reviewed and stable Respiratory status: spontaneous breathing, nonlabored ventilation, respiratory function stable and patient connected to nasal cannula oxygen Cardiovascular status: stable and blood pressure returned to baseline Postop Assessment: no apparent nausea or vomiting Anesthetic complications: no    Veda Canning

## 2018-05-26 NOTE — Anesthesia Procedure Notes (Signed)
Procedure Name: MAC Performed by: Cabell Lazenby, CRNA Pre-anesthesia Checklist: Patient identified, Emergency Drugs available, Suction available, Timeout performed and Patient being monitored Patient Re-evaluated:Patient Re-evaluated prior to induction Oxygen Delivery Method: Nasal cannula Placement Confirmation: positive ETCO2       

## 2018-05-27 ENCOUNTER — Encounter: Payer: Self-pay | Admitting: Ophthalmology

## 2018-07-22 DIAGNOSIS — Z1231 Encounter for screening mammogram for malignant neoplasm of breast: Secondary | ICD-10-CM | POA: Diagnosis not present

## 2018-09-30 DIAGNOSIS — H401131 Primary open-angle glaucoma, bilateral, mild stage: Secondary | ICD-10-CM | POA: Diagnosis not present

## 2018-10-27 DIAGNOSIS — H401131 Primary open-angle glaucoma, bilateral, mild stage: Secondary | ICD-10-CM | POA: Diagnosis not present

## 2018-11-23 DIAGNOSIS — H6981 Other specified disorders of Eustachian tube, right ear: Secondary | ICD-10-CM | POA: Diagnosis not present

## 2018-11-23 DIAGNOSIS — H9042 Sensorineural hearing loss, unilateral, left ear, with unrestricted hearing on the contralateral side: Secondary | ICD-10-CM | POA: Diagnosis not present

## 2018-11-23 DIAGNOSIS — H95191 Other disorders following mastoidectomy, right ear: Secondary | ICD-10-CM | POA: Diagnosis not present

## 2018-11-23 DIAGNOSIS — J3489 Other specified disorders of nose and nasal sinuses: Secondary | ICD-10-CM | POA: Diagnosis not present

## 2019-03-25 DIAGNOSIS — Z800 Family history of malignant neoplasm of digestive organs: Secondary | ICD-10-CM | POA: Diagnosis not present

## 2019-03-25 DIAGNOSIS — K5730 Diverticulosis of large intestine without perforation or abscess without bleeding: Secondary | ICD-10-CM | POA: Diagnosis not present

## 2019-03-25 DIAGNOSIS — Z86010 Personal history of colonic polyps: Secondary | ICD-10-CM | POA: Diagnosis not present

## 2019-04-01 DIAGNOSIS — D225 Melanocytic nevi of trunk: Secondary | ICD-10-CM | POA: Diagnosis not present

## 2019-04-01 DIAGNOSIS — L430 Hypertrophic lichen planus: Secondary | ICD-10-CM | POA: Diagnosis not present

## 2019-04-01 DIAGNOSIS — L814 Other melanin hyperpigmentation: Secondary | ICD-10-CM | POA: Diagnosis not present

## 2019-04-01 DIAGNOSIS — Z85828 Personal history of other malignant neoplasm of skin: Secondary | ICD-10-CM | POA: Diagnosis not present

## 2019-04-01 DIAGNOSIS — C44311 Basal cell carcinoma of skin of nose: Secondary | ICD-10-CM | POA: Diagnosis not present

## 2019-04-01 DIAGNOSIS — L821 Other seborrheic keratosis: Secondary | ICD-10-CM | POA: Diagnosis not present

## 2019-04-01 DIAGNOSIS — D485 Neoplasm of uncertain behavior of skin: Secondary | ICD-10-CM | POA: Diagnosis not present

## 2019-04-27 DIAGNOSIS — H401131 Primary open-angle glaucoma, bilateral, mild stage: Secondary | ICD-10-CM | POA: Diagnosis not present

## 2019-04-27 DIAGNOSIS — D3132 Benign neoplasm of left choroid: Secondary | ICD-10-CM | POA: Diagnosis not present

## 2019-05-04 ENCOUNTER — Other Ambulatory Visit: Payer: Self-pay | Admitting: Family Medicine

## 2019-05-04 DIAGNOSIS — I999 Unspecified disorder of circulatory system: Secondary | ICD-10-CM

## 2019-05-06 ENCOUNTER — Ambulatory Visit
Admission: RE | Admit: 2019-05-06 | Discharge: 2019-05-06 | Disposition: A | Discharge status: Home / Self Care | Payer: Managed Care, Other (non HMO) | Source: Ambulatory Visit | Attending: Family Medicine | Admitting: Family Medicine

## 2019-05-06 DIAGNOSIS — I999 Unspecified disorder of circulatory system: Secondary | ICD-10-CM

## 2019-05-06 DIAGNOSIS — R0989 Other specified symptoms and signs involving the circulatory and respiratory systems: Secondary | ICD-10-CM | POA: Diagnosis not present

## 2019-06-03 DIAGNOSIS — C44311 Basal cell carcinoma of skin of nose: Secondary | ICD-10-CM | POA: Diagnosis not present

## 2019-07-07 DIAGNOSIS — M79672 Pain in left foot: Secondary | ICD-10-CM | POA: Diagnosis not present

## 2019-07-09 DIAGNOSIS — S90122A Contusion of left lesser toe(s) without damage to nail, initial encounter: Secondary | ICD-10-CM | POA: Diagnosis not present

## 2019-07-09 DIAGNOSIS — M778 Other enthesopathies, not elsewhere classified: Secondary | ICD-10-CM | POA: Diagnosis not present

## 2019-07-09 DIAGNOSIS — S9032XA Contusion of left foot, initial encounter: Secondary | ICD-10-CM | POA: Diagnosis not present

## 2019-07-09 DIAGNOSIS — M2032 Hallux varus (acquired), left foot: Secondary | ICD-10-CM | POA: Diagnosis not present

## 2019-07-09 DIAGNOSIS — M2042 Other hammer toe(s) (acquired), left foot: Secondary | ICD-10-CM | POA: Diagnosis not present

## 2019-07-22 DIAGNOSIS — S9032XA Contusion of left foot, initial encounter: Secondary | ICD-10-CM | POA: Diagnosis not present

## 2019-07-22 DIAGNOSIS — M2032 Hallux varus (acquired), left foot: Secondary | ICD-10-CM | POA: Diagnosis not present

## 2019-07-22 DIAGNOSIS — S90122A Contusion of left lesser toe(s) without damage to nail, initial encounter: Secondary | ICD-10-CM | POA: Diagnosis not present

## 2019-07-22 DIAGNOSIS — S92325D Nondisplaced fracture of second metatarsal bone, left foot, subsequent encounter for fracture with routine healing: Secondary | ICD-10-CM | POA: Diagnosis not present

## 2019-08-11 ENCOUNTER — Other Ambulatory Visit: Payer: Self-pay

## 2019-08-11 DIAGNOSIS — Z20828 Contact with and (suspected) exposure to other viral communicable diseases: Secondary | ICD-10-CM | POA: Diagnosis not present

## 2019-08-11 DIAGNOSIS — Z20822 Contact with and (suspected) exposure to covid-19: Secondary | ICD-10-CM

## 2019-08-13 LAB — NOVEL CORONAVIRUS, NAA: SARS-CoV-2, NAA: DETECTED — AB

## 2019-08-15 ENCOUNTER — Telehealth: Payer: Self-pay | Admitting: Unknown Physician Specialty

## 2019-08-15 NOTE — Telephone Encounter (Signed)
Discussed with patient about Covid symptoms and the use of bamlanivimab, a monoclonal antibody infusion for those with mild to moderate Covid symptoms and at a high risk of hospitalization.  Pt is not qualified for this infusion as will have sxs greater than 10 days at the time of infusion

## 2019-08-23 DIAGNOSIS — S92325D Nondisplaced fracture of second metatarsal bone, left foot, subsequent encounter for fracture with routine healing: Secondary | ICD-10-CM | POA: Diagnosis not present

## 2019-08-23 DIAGNOSIS — M79672 Pain in left foot: Secondary | ICD-10-CM | POA: Diagnosis not present

## 2019-10-26 DIAGNOSIS — H401131 Primary open-angle glaucoma, bilateral, mild stage: Secondary | ICD-10-CM | POA: Diagnosis not present

## 2019-11-02 DIAGNOSIS — H33311 Horseshoe tear of retina without detachment, right eye: Secondary | ICD-10-CM | POA: Diagnosis not present

## 2020-01-18 DIAGNOSIS — R635 Abnormal weight gain: Secondary | ICD-10-CM | POA: Diagnosis not present

## 2020-02-23 DIAGNOSIS — R635 Abnormal weight gain: Secondary | ICD-10-CM | POA: Diagnosis not present

## 2020-03-01 DIAGNOSIS — E669 Obesity, unspecified: Secondary | ICD-10-CM | POA: Diagnosis not present

## 2020-03-01 DIAGNOSIS — Z0000 Encounter for general adult medical examination without abnormal findings: Secondary | ICD-10-CM | POA: Diagnosis not present

## 2020-03-01 DIAGNOSIS — E559 Vitamin D deficiency, unspecified: Secondary | ICD-10-CM | POA: Diagnosis not present

## 2020-03-01 DIAGNOSIS — E785 Hyperlipidemia, unspecified: Secondary | ICD-10-CM | POA: Diagnosis not present

## 2020-03-01 DIAGNOSIS — Z1159 Encounter for screening for other viral diseases: Secondary | ICD-10-CM | POA: Diagnosis not present

## 2020-03-01 DIAGNOSIS — D329 Benign neoplasm of meninges, unspecified: Secondary | ICD-10-CM | POA: Diagnosis not present

## 2020-03-22 ENCOUNTER — Encounter: Payer: Self-pay | Admitting: General Practice

## 2020-04-12 DIAGNOSIS — D329 Benign neoplasm of meninges, unspecified: Secondary | ICD-10-CM | POA: Diagnosis not present

## 2020-04-19 DIAGNOSIS — R635 Abnormal weight gain: Secondary | ICD-10-CM | POA: Diagnosis not present

## 2020-04-27 DIAGNOSIS — D320 Benign neoplasm of cerebral meninges: Secondary | ICD-10-CM | POA: Diagnosis not present

## 2020-04-27 DIAGNOSIS — D329 Benign neoplasm of meninges, unspecified: Secondary | ICD-10-CM | POA: Diagnosis not present

## 2020-05-26 DIAGNOSIS — H401131 Primary open-angle glaucoma, bilateral, mild stage: Secondary | ICD-10-CM | POA: Diagnosis not present

## 2020-07-19 DIAGNOSIS — R635 Abnormal weight gain: Secondary | ICD-10-CM | POA: Diagnosis not present

## 2020-08-04 ENCOUNTER — Ambulatory Visit: Payer: PPO | Admitting: Internal Medicine

## 2020-09-01 DIAGNOSIS — U071 COVID-19: Secondary | ICD-10-CM | POA: Diagnosis not present

## 2020-09-20 DIAGNOSIS — Z1231 Encounter for screening mammogram for malignant neoplasm of breast: Secondary | ICD-10-CM | POA: Diagnosis not present

## 2020-11-01 ENCOUNTER — Ambulatory Visit: Payer: PPO | Admitting: Internal Medicine

## 2020-11-01 ENCOUNTER — Encounter: Payer: Self-pay | Admitting: Internal Medicine

## 2020-11-01 ENCOUNTER — Other Ambulatory Visit: Payer: Self-pay

## 2020-11-01 VITALS — BP 132/80 | HR 70 | Ht 61.5 in | Wt 180.6 lb

## 2020-11-01 DIAGNOSIS — E7800 Pure hypercholesterolemia, unspecified: Secondary | ICD-10-CM | POA: Diagnosis not present

## 2020-11-01 DIAGNOSIS — Z789 Other specified health status: Secondary | ICD-10-CM

## 2020-11-01 NOTE — Progress Notes (Signed)
LIPID CLINIC CONSULT NOTE  Chief Complaint:  Marked dyslipidemia  Primary Care Physician: [name] Stains, MD  Primary Cardiologist:  No primary care provider on file.  HPI:  [name] is a 80 y.o. female who is being seen today for the evaluation of marked dyslipidemia at the request of [name] Stains, MD.  This is a pleasant 80 year old female kindly referred by [name] for evaluation and management of marked dyslipidemia.  [name] has been a patient of the [facility] for which she has attended for weight loss.  This is apparently run by [name], an internal medicine physician who specializes in weight management.  She provided some literature from him today indicating that he is aware that a ketogenic diet can significantly increase cholesterol is in her case.  But also makes arguments that metabolic syndrome is more significant of a predictor of coronary disease and LDL and in fact that patients with age over 63, statins are not recommended by guidelines.  This is actually not true based on current 2018 guidelines.  The arguments regarding metabolic syndrome versus LDL cholesterol are also debated.  I am not surprised to see very high cholesterol in a patient on a ketogenic diet which is high in saturated fats.  There are decades of research showing that high saturated fats when sustained will lead to significant cardiovascular disease.  Her most recent lipids in June 2021 showed total cholesterol 460, HDL 64, LDL 377 and triglycerides 114.  She is not on therapy for this.  There is no evidence of early onset heart disease or anything to suggest a familial hyperlipidemia.  I suspect that this is primarily just related to her diet.  She says she eats no more than about 20 g of carbohydrates a day.  PMHx:  Past Medical History:  Diagnosis Date  . Arthritis    hands, legs  . Cancer (HCC)    Basal Cell left cheek, left earlobe  . GERD (gastroesophageal reflux  disease)   . HOH (hard of hearing)    hearing aid left ear, deaf in right ear  . Wears hearing aid in both ears     Past Surgical History:  Procedure Laterality Date  . BACK SURGERY  2010  . CATARACT EXTRACTION W/PHACO Right 04/28/2018   Procedure: CATARACT EXTRACTION PHACO AND INTRAOCULAR LENS PLACEMENT ([place]) RIGHT IVA TOPICAL  toric lens;  Surgeon: Leandrew Koyanagi, MD;  Location: [place];  Service: Ophthalmology;  Laterality: Right;  . CATARACT EXTRACTION W/PHACO Left 05/26/2018   Procedure: CATARACT EXTRACTION PHACO AND INTRAOCULAR LENS PLACEMENT ([place]) LEFT TORIC LENS;  Surgeon: Leandrew Koyanagi, MD;  Location: [facility];  Service: Ophthalmology;  Laterality: Left;  . FOOT SURGERY Left 1999   unsure if metal  . INNER EAR SURGERY Bilateral   . JOINT REPLACEMENT Bilateral [id number]   Total Knee Replacement  . TONSILLECTOMY    . TOTAL HIP ARTHROPLASTY Left 07/16/2016   Procedure: TOTAL HIP ARTHROPLASTY ANTERIOR APPROACH;  Surgeon: Hessie Knows, MD;  Location: [facility];  Service: Orthopedics;  Laterality: Left;  . TOTAL SHOULDER REPLACEMENT Right 2016    FAMHx:  No family history on file. No history of early onset CAD or very high cholesterol.  SOCHx:   reports that she has never smoked. She has never used smokeless tobacco. She reports that she does not drink alcohol and does not use drugs.  ALLERGIES:  Allergies  Allergen Reactions  . Percocet [Oxycodone-Acetaminophen] Itching  ROS: Pertinent items noted in HPI and remainder of comprehensive ROS otherwise negative.  HOME MEDS: Current Outpatient Medications on File Prior to Visit  Medication Sig Dispense Refill  . Ascorbic Acid (VITAMIN C) 1000 MG tablet Take 1,000 mg by mouth daily.    Marland Kitchen aspirin EC 81 MG tablet Take 81 mg by mouth daily.    . B Complex Vitamins (VITAMIN B COMPLEX PO) Take 1 capsule by mouth daily.    . Biotin 5 MG CAPS Take 10 mg by mouth daily.    . Cholecalciferol  (VITAMIN D3) 10000 units TABS Take 1 tablet by mouth daily. Pt takes 1/2 tablet    . CHROMIUM-MANGANESE-ZINC PO Take 1 tablet by mouth daily.    . Grape Seed Extract 100 MG CAPS Take 2 capsules by mouth daily.    Marland Kitchen latanoprost (XALATAN) 0.005 % ophthalmic solution Place 1 drop into both eyes at bedtime.    Marland Kitchen Lysine HCl 1000 MG TABS Take 1,000 mg by mouth daily.    . magnesium gluconate (MAGONATE) 500 MG tablet Take 500 mg by mouth daily.    . Multiple Vitamin (MULTI-VITAMIN) tablet Take 1 tablet by mouth daily.    . Calcium Polycarbophil (FIBERCON PO) Take 1,000 mg by mouth daily.    . CHLOROPHYLL PO Take by mouth daily. (ChlorOxygen)    . Coenzyme Q10 (COQ10) 100 MG CAPS Take by mouth daily.    Marland Kitchen docusate sodium (COLACE) 100 MG capsule Take 100 mg by mouth daily.    . Evening Primrose Oil 1000 MG CAPS Take 1,000 mg by mouth daily.    . Graham, Centella asiatica, (GOTU KOLA PO) Take by mouth daily. (CentelleVein)    . Multiple Vitamins-Minerals (ZINC PO) Take by mouth daily.    . Omega-3 Fatty Acids (FISH OIL) 1000 MG CPDR Take 1,000 mg by mouth daily.    . Potassium 99 MG TABS Take 1 tablet by mouth daily.    . Red Yeast Rice 600 MG TABS Take 1,200 mg by mouth daily.     No current facility-administered medications on file prior to visit.    LABS/IMAGING: No results found for this or any previous visit (from the past 48 hour(s)). No results found.  LIPID PANEL: No results found for: CHOL, TRIG, HDL, CHOLHDL, VLDL, LDLCALC, LDLDIRECT  WEIGHTS: Wt Readings from Last 3 Encounters:  11/01/20 180 lb 9.6 oz (81.9 kg)  05/26/18 232 lb (105.2 kg)  04/28/18 232 lb (105.2 kg)    VITALS: BP 132/80 (BP Location: Left Arm, Patient Position: Sitting)   Pulse 70   Ht 5' 1.5" (1.562 m)   Wt 180 lb 9.6 oz (81.9 kg)   SpO2 96%   BMI 33.57 kg/m   EXAM: General appearance: alert and no distress Neck: no carotid bruit, no JVD and thyroid not enlarged, symmetric, no  tenderness/mass/nodules Lungs: clear to auscultation bilaterally Heart: regular rate and rhythm, S1, S2 normal, no murmur, click, rub or gallop Abdomen: soft, non-tender; bowel sounds normal; no masses,  no organomegaly Extremities: extremities normal, atraumatic, no cyanosis or edema Pulses: 2+ and symmetric Skin: Skin color, texture, turgor normal. No rashes or lesions Neurologic: Grossly normal Psych: Pleasant  EKG: Deferred  ASSESSMENT: 1. Elevated LDL cholesterol greater than 190 2. Ketogenic diet 3. Obesity with recent significant weight loss  PLAN: 1.   Mrs. Mckeon has an elevated LDL cholesterol much greater than 190 but is likely related to a ketogenic diet very low in carbohydrates and high in  saturated fats.  Multiple studies have evaluated this but the evidence is not clear as to the long-term cardiovascular risk of this diet.  Historical evidence for years demonstrated a connection between high saturated fat diet and cardiovascular disease.  I disagree that LDL alone less of a significant risk factor than the metabolic syndrome components.  When one evaluates her lipid profile, she has no features of the metabolic dyslipidemia which would typically be a low HDL cholesterol and high triglycerides.  This is primarily high LDL again driven by high saturated fat intake.  The National lipid Association has provided guidance regarding this diet and recommends it only for short-term weight loss and then recommends transitioning over to a more balanced plant-based or Mediterranean diet to maintain weight and improve lipids.  That being said, Ms. Appleyard seems to be content with her diet and I am not certain believes that this is of cardiovascular risk to her.  Additionally, her weight loss physician also does not believe that this is a deleterious diet with regards to cardiac risk.  We discussed the possibility of coronary calcium score to evaluate for any evidence of atherosclerosis which if  abnormal would certainly suggest that she should not maintain this significant dyslipidemia and I would argue her lipids should be treated as well.  She wishes to consider this further and will contact us if she wants to proceed.  Follow-up with me as needed.  Pixie Casino, MD, [facility], [place] Director of the Advanced Lipid Disorders &  Cardiovascular Risk Reduction Clinic Diplomate of the American Board of Clinical Lipidology Attending Cardiologist  Direct Dial: [phone]  Fax: [phone]  Website:  [email or web address] 11/01/2020, 9:15 AM

## 2020-11-01 NOTE — Patient Instructions (Signed)
Medication Instructions:  Your physician recommends that you continue on your current medications as directed. Please refer to the Current Medication list given to you today.  *If you need a refill on your cardiac medications before your next appointment, please call your pharmacy*  Testing/Procedures: Dr. Debara Pickett has ordered a CT coronary calcium score. This test is done at [address]. This is $99 out of pocket.   Coronary CalciumScan A coronary calcium scan is an imaging test used to look for deposits of calcium and other fatty materials (plaques) in the inner lining of the blood vessels of the heart (coronary arteries). These deposits of calcium and plaques can partly clog and narrow the coronary arteries without producing any symptoms or warning signs. This puts a person at risk for a heart attack. This test can detect these deposits before symptoms develop. Tell a health care provider about:  Any allergies you have.  All medicines you are taking, including vitamins, herbs, eye drops, creams, and over-the-counter medicines.  Any problems you or family members have had with anesthetic medicines.  Any blood disorders you have.  Any surgeries you have had.  Any medical conditions you have.  Whether you are pregnant or may be pregnant. What are the risks? Generally, this is a safe procedure. However, problems may occur, including:  Harm to a pregnant woman and her unborn baby. This test involves the use of radiation. Radiation exposure can be dangerous to a pregnant woman and her unborn baby. If you are pregnant, you generally should not have this procedure done.  Slight increase in the risk of cancer. This is because of the radiation involved in the test. What happens before the procedure? No preparation is needed for this procedure. What happens during the procedure?  You will undress and remove any jewelry around your neck or chest.  You will put on a  hospital gown.  Sticky electrodes will be placed on your chest. The electrodes will be connected to an electrocardiogram (ECG) machine to record a tracing of the electrical activity of your heart.  A CT scanner will take pictures of your heart. During this time, you will be asked to lie still and hold your breath for 2-3 seconds while a picture of your heart is being taken. The procedure may vary among health care providers and hospitals. What happens after the procedure?  You can get dressed.  You can return to your normal activities.  It is up to you to get the results of your test. Ask your health care provider, or the department that is doing the test, when your results will be ready. Summary  A coronary calcium scan is an imaging test used to look for deposits of calcium and other fatty materials (plaques) in the inner lining of the blood vessels of the heart (coronary arteries).  Generally, this is a safe procedure. Tell your health care provider if you are pregnant or may be pregnant.  No preparation is needed for this procedure.  A CT scanner will take pictures of your heart.  You can return to your normal activities after the scan is done. This information is not intended to replace advice given to you by your health care provider. Make sure you discuss any questions you have with your health care provider. Document Released: 02/15/2008 Document Revised: 07/08/2016 Document Reviewed: 07/08/2016 Elsevier Interactive Patient Education  2017 Ax[employer]: At [facility], you and your health needs are our priority.  As part of our continuing mission to provide you with exceptional heart care, we have created designated Provider Care Teams.  These Care Teams include your primary Cardiologist (physician) and Advanced Practice Providers (APPs -  Physician Assistants and Nurse Practitioners) who all work together to provide you with the care you need, when you need  it.  We recommend signing up for the patient portal called "MyChart".  Sign up information is provided on this After Visit Summary.  MyChart is used to connect with patients for Virtual Visits (Telemedicine).  Patients are able to view lab/test results, encounter notes, upcoming appointments, etc.  Non-urgent messages can be sent to your provider as well.   To learn more about what you can do with MyChart, go to [email or web address].    Your next appointment:   AS NEEDED with Dr. Debara Pickett

## 2020-11-06 DIAGNOSIS — H401131 Primary open-angle glaucoma, bilateral, mild stage: Secondary | ICD-10-CM | POA: Diagnosis not present

## 2020-11-10 DIAGNOSIS — H401131 Primary open-angle glaucoma, bilateral, mild stage: Secondary | ICD-10-CM | POA: Diagnosis not present

## 2020-11-15 DIAGNOSIS — E785 Hyperlipidemia, unspecified: Secondary | ICD-10-CM | POA: Diagnosis not present

## 2020-11-15 DIAGNOSIS — E559 Vitamin D deficiency, unspecified: Secondary | ICD-10-CM | POA: Diagnosis not present

## 2020-11-24 ENCOUNTER — Other Ambulatory Visit: Payer: Self-pay

## 2020-11-24 ENCOUNTER — Ambulatory Visit (INDEPENDENT_AMBULATORY_CARE_PROVIDER_SITE_OTHER)
Admission: RE | Admit: 2020-11-24 | Discharge: 2020-11-24 | Disposition: A | Discharge status: Home / Self Care | Payer: Self-pay | Source: Ambulatory Visit | Attending: Internal Medicine | Admitting: Internal Medicine

## 2020-11-24 DIAGNOSIS — E7800 Pure hypercholesterolemia, unspecified: Secondary | ICD-10-CM

## 2020-12-06 DIAGNOSIS — M2041 Other hammer toe(s) (acquired), right foot: Secondary | ICD-10-CM | POA: Diagnosis not present

## 2020-12-06 DIAGNOSIS — M2042 Other hammer toe(s) (acquired), left foot: Secondary | ICD-10-CM | POA: Diagnosis not present

## 2020-12-06 DIAGNOSIS — M898X9 Other specified disorders of bone, unspecified site: Secondary | ICD-10-CM | POA: Diagnosis not present

## 2020-12-06 DIAGNOSIS — M2032 Hallux varus (acquired), left foot: Secondary | ICD-10-CM | POA: Diagnosis not present

## 2020-12-20 DIAGNOSIS — R635 Abnormal weight gain: Secondary | ICD-10-CM | POA: Diagnosis not present

## 2020-12-28 DIAGNOSIS — D485 Neoplasm of uncertain behavior of skin: Secondary | ICD-10-CM | POA: Diagnosis not present

## 2020-12-28 DIAGNOSIS — D225 Melanocytic nevi of trunk: Secondary | ICD-10-CM | POA: Diagnosis not present

## 2020-12-28 DIAGNOSIS — L905 Scar conditions and fibrosis of skin: Secondary | ICD-10-CM | POA: Diagnosis not present

## 2020-12-28 DIAGNOSIS — L821 Other seborrheic keratosis: Secondary | ICD-10-CM | POA: Diagnosis not present

## 2020-12-28 DIAGNOSIS — L820 Inflamed seborrheic keratosis: Secondary | ICD-10-CM | POA: Diagnosis not present

## 2020-12-28 DIAGNOSIS — Z85828 Personal history of other malignant neoplasm of skin: Secondary | ICD-10-CM | POA: Diagnosis not present

## 2020-12-28 DIAGNOSIS — L814 Other melanin hyperpigmentation: Secondary | ICD-10-CM | POA: Diagnosis not present

## 2021-05-17 DIAGNOSIS — H401112 Primary open-angle glaucoma, right eye, moderate stage: Secondary | ICD-10-CM | POA: Diagnosis not present

## 2021-05-17 DIAGNOSIS — H401121 Primary open-angle glaucoma, left eye, mild stage: Secondary | ICD-10-CM | POA: Diagnosis not present

## 2021-07-06 DIAGNOSIS — H90A21 Sensorineural hearing loss, unilateral, right ear, with restricted hearing on the contralateral side: Secondary | ICD-10-CM | POA: Diagnosis not present

## 2021-07-06 DIAGNOSIS — H7101 Cholesteatoma of attic, right ear: Secondary | ICD-10-CM | POA: Diagnosis not present

## 2021-07-06 DIAGNOSIS — Z974 Presence of external hearing-aid: Secondary | ICD-10-CM | POA: Diagnosis not present

## 2021-07-06 DIAGNOSIS — H906 Mixed conductive and sensorineural hearing loss, bilateral: Secondary | ICD-10-CM | POA: Diagnosis not present

## 2021-07-06 DIAGNOSIS — H73892 Other specified disorders of tympanic membrane, left ear: Secondary | ICD-10-CM | POA: Diagnosis not present

## 2021-07-06 DIAGNOSIS — H7411 Adhesive right middle ear disease: Secondary | ICD-10-CM | POA: Diagnosis not present

## 2021-07-06 DIAGNOSIS — H90A32 Mixed conductive and sensorineural hearing loss, unilateral, left ear with restricted hearing on the contralateral side: Secondary | ICD-10-CM | POA: Diagnosis not present

## 2021-08-15 DIAGNOSIS — H7101 Cholesteatoma of attic, right ear: Secondary | ICD-10-CM | POA: Diagnosis not present

## 2021-08-15 DIAGNOSIS — H9041 Sensorineural hearing loss, unilateral, right ear, with unrestricted hearing on the contralateral side: Secondary | ICD-10-CM | POA: Diagnosis not present

## 2021-08-15 DIAGNOSIS — H90A21 Sensorineural hearing loss, unilateral, right ear, with restricted hearing on the contralateral side: Secondary | ICD-10-CM | POA: Diagnosis not present

## 2021-08-15 DIAGNOSIS — H9042 Sensorineural hearing loss, unilateral, left ear, with unrestricted hearing on the contralateral side: Secondary | ICD-10-CM | POA: Diagnosis not present

## 2021-08-15 DIAGNOSIS — H90A32 Mixed conductive and sensorineural hearing loss, unilateral, left ear with restricted hearing on the contralateral side: Secondary | ICD-10-CM | POA: Diagnosis not present

## 2021-08-16 DIAGNOSIS — H90A32 Mixed conductive and sensorineural hearing loss, unilateral, left ear with restricted hearing on the contralateral side: Secondary | ICD-10-CM | POA: Diagnosis not present

## 2021-08-16 DIAGNOSIS — H90A21 Sensorineural hearing loss, unilateral, right ear, with restricted hearing on the contralateral side: Secondary | ICD-10-CM | POA: Diagnosis not present

## 2021-08-16 DIAGNOSIS — H7101 Cholesteatoma of attic, right ear: Secondary | ICD-10-CM | POA: Diagnosis not present

## 2021-08-16 DIAGNOSIS — D329 Benign neoplasm of meninges, unspecified: Secondary | ICD-10-CM | POA: Diagnosis not present

## 2021-09-12 DIAGNOSIS — D329 Benign neoplasm of meninges, unspecified: Secondary | ICD-10-CM | POA: Diagnosis not present

## 2021-10-08 DIAGNOSIS — Z1231 Encounter for screening mammogram for malignant neoplasm of breast: Secondary | ICD-10-CM | POA: Diagnosis not present

## 2021-10-11 DIAGNOSIS — D320 Benign neoplasm of cerebral meninges: Secondary | ICD-10-CM | POA: Diagnosis not present

## 2021-10-11 DIAGNOSIS — D329 Benign neoplasm of meninges, unspecified: Secondary | ICD-10-CM | POA: Diagnosis not present

## 2021-11-13 DIAGNOSIS — H401121 Primary open-angle glaucoma, left eye, mild stage: Secondary | ICD-10-CM | POA: Diagnosis not present

## 2021-12-13 DIAGNOSIS — Z510 Encounter for antineoplastic radiation therapy: Secondary | ICD-10-CM | POA: Diagnosis not present

## 2021-12-13 DIAGNOSIS — D320 Benign neoplasm of cerebral meninges: Secondary | ICD-10-CM | POA: Diagnosis not present

## 2021-12-13 DIAGNOSIS — D329 Benign neoplasm of meninges, unspecified: Secondary | ICD-10-CM | POA: Diagnosis not present

## 2022-02-15 DIAGNOSIS — H90A32 Mixed conductive and sensorineural hearing loss, unilateral, left ear with restricted hearing on the contralateral side: Secondary | ICD-10-CM | POA: Diagnosis not present

## 2022-02-15 DIAGNOSIS — D320 Benign neoplasm of cerebral meninges: Secondary | ICD-10-CM | POA: Diagnosis not present

## 2022-02-15 DIAGNOSIS — D329 Benign neoplasm of meninges, unspecified: Secondary | ICD-10-CM | POA: Diagnosis not present

## 2022-02-15 DIAGNOSIS — Z923 Personal history of irradiation: Secondary | ICD-10-CM | POA: Diagnosis not present

## 2022-02-15 DIAGNOSIS — Z01118 Encounter for examination of ears and hearing with other abnormal findings: Secondary | ICD-10-CM | POA: Diagnosis not present

## 2022-02-15 DIAGNOSIS — H7411 Adhesive right middle ear disease: Secondary | ICD-10-CM | POA: Diagnosis not present

## 2022-02-15 DIAGNOSIS — H90A21 Sensorineural hearing loss, unilateral, right ear, with restricted hearing on the contralateral side: Secondary | ICD-10-CM | POA: Diagnosis not present

## 2022-05-10 DIAGNOSIS — H401121 Primary open-angle glaucoma, left eye, mild stage: Secondary | ICD-10-CM | POA: Diagnosis not present

## 2022-05-10 DIAGNOSIS — Z961 Presence of intraocular lens: Secondary | ICD-10-CM | POA: Diagnosis not present

## 2022-05-10 DIAGNOSIS — H401112 Primary open-angle glaucoma, right eye, moderate stage: Secondary | ICD-10-CM | POA: Diagnosis not present

## 2022-06-18 DIAGNOSIS — D329 Benign neoplasm of meninges, unspecified: Secondary | ICD-10-CM | POA: Diagnosis not present

## 2022-06-18 DIAGNOSIS — D320 Benign neoplasm of cerebral meninges: Secondary | ICD-10-CM | POA: Diagnosis not present

## 2022-06-21 DIAGNOSIS — Z974 Presence of external hearing-aid: Secondary | ICD-10-CM | POA: Diagnosis not present

## 2022-06-21 DIAGNOSIS — D320 Benign neoplasm of cerebral meninges: Secondary | ICD-10-CM | POA: Diagnosis not present

## 2022-06-21 DIAGNOSIS — D329 Benign neoplasm of meninges, unspecified: Secondary | ICD-10-CM | POA: Diagnosis not present

## 2022-06-21 DIAGNOSIS — H7411 Adhesive right middle ear disease: Secondary | ICD-10-CM | POA: Diagnosis not present

## 2022-06-21 DIAGNOSIS — H90A32 Mixed conductive and sensorineural hearing loss, unilateral, left ear with restricted hearing on the contralateral side: Secondary | ICD-10-CM | POA: Diagnosis not present

## 2022-06-21 DIAGNOSIS — Z923 Personal history of irradiation: Secondary | ICD-10-CM | POA: Diagnosis not present

## 2022-06-21 DIAGNOSIS — H90A21 Sensorineural hearing loss, unilateral, right ear, with restricted hearing on the contralateral side: Secondary | ICD-10-CM | POA: Diagnosis not present

## 2022-09-04 DIAGNOSIS — Z8669 Personal history of other diseases of the nervous system and sense organs: Secondary | ICD-10-CM | POA: Diagnosis not present

## 2022-09-04 DIAGNOSIS — H6691 Otitis media, unspecified, right ear: Secondary | ICD-10-CM | POA: Diagnosis not present

## 2022-09-04 DIAGNOSIS — H903 Sensorineural hearing loss, bilateral: Secondary | ICD-10-CM | POA: Diagnosis not present

## 2022-09-04 DIAGNOSIS — Z45321 Encounter for adjustment and management of cochlear device: Secondary | ICD-10-CM | POA: Diagnosis not present

## 2022-09-04 DIAGNOSIS — H90A21 Sensorineural hearing loss, unilateral, right ear, with restricted hearing on the contralateral side: Secondary | ICD-10-CM | POA: Diagnosis not present

## 2022-09-04 DIAGNOSIS — Z86011 Personal history of benign neoplasm of the brain: Secondary | ICD-10-CM | POA: Diagnosis not present

## 2022-09-04 DIAGNOSIS — H90A32 Mixed conductive and sensorineural hearing loss, unilateral, left ear with restricted hearing on the contralateral side: Secondary | ICD-10-CM | POA: Diagnosis not present

## 2022-09-04 DIAGNOSIS — E785 Hyperlipidemia, unspecified: Secondary | ICD-10-CM | POA: Diagnosis not present

## 2022-09-04 DIAGNOSIS — Z9889 Other specified postprocedural states: Secondary | ICD-10-CM | POA: Diagnosis not present

## 2022-09-13 DIAGNOSIS — Z48810 Encounter for surgical aftercare following surgery on the sense organs: Secondary | ICD-10-CM | POA: Diagnosis not present

## 2022-09-13 DIAGNOSIS — H6691 Otitis media, unspecified, right ear: Secondary | ICD-10-CM | POA: Diagnosis not present

## 2022-09-13 DIAGNOSIS — H90A32 Mixed conductive and sensorineural hearing loss, unilateral, left ear with restricted hearing on the contralateral side: Secondary | ICD-10-CM | POA: Diagnosis not present

## 2022-09-13 DIAGNOSIS — L03811 Cellulitis of head [any part, except face]: Secondary | ICD-10-CM | POA: Diagnosis not present

## 2022-09-13 DIAGNOSIS — H90A21 Sensorineural hearing loss, unilateral, right ear, with restricted hearing on the contralateral side: Secondary | ICD-10-CM | POA: Diagnosis not present

## 2022-09-19 DIAGNOSIS — H90A32 Mixed conductive and sensorineural hearing loss, unilateral, left ear with restricted hearing on the contralateral side: Secondary | ICD-10-CM | POA: Diagnosis not present

## 2022-09-19 DIAGNOSIS — L03811 Cellulitis of head [any part, except face]: Secondary | ICD-10-CM | POA: Diagnosis not present

## 2022-09-19 DIAGNOSIS — Z9621 Cochlear implant status: Secondary | ICD-10-CM | POA: Diagnosis not present

## 2022-09-19 DIAGNOSIS — H90A21 Sensorineural hearing loss, unilateral, right ear, with restricted hearing on the contralateral side: Secondary | ICD-10-CM | POA: Diagnosis not present

## 2022-09-19 DIAGNOSIS — D329 Benign neoplasm of meninges, unspecified: Secondary | ICD-10-CM | POA: Diagnosis not present

## 2022-09-19 DIAGNOSIS — Z885 Allergy status to narcotic agent status: Secondary | ICD-10-CM | POA: Diagnosis not present

## 2022-09-19 DIAGNOSIS — Z48810 Encounter for surgical aftercare following surgery on the sense organs: Secondary | ICD-10-CM | POA: Diagnosis not present

## 2022-09-19 DIAGNOSIS — Z886 Allergy status to analgesic agent status: Secondary | ICD-10-CM | POA: Diagnosis not present

## 2022-10-03 DIAGNOSIS — Z923 Personal history of irradiation: Secondary | ICD-10-CM | POA: Diagnosis not present

## 2022-10-03 DIAGNOSIS — H90A31 Mixed conductive and sensorineural hearing loss, unilateral, right ear with restricted hearing on the contralateral side: Secondary | ICD-10-CM | POA: Diagnosis not present

## 2022-10-03 DIAGNOSIS — H90A21 Sensorineural hearing loss, unilateral, right ear, with restricted hearing on the contralateral side: Secondary | ICD-10-CM | POA: Diagnosis not present

## 2022-10-03 DIAGNOSIS — Z9621 Cochlear implant status: Secondary | ICD-10-CM | POA: Diagnosis not present

## 2022-10-03 DIAGNOSIS — Z885 Allergy status to narcotic agent status: Secondary | ICD-10-CM | POA: Diagnosis not present

## 2022-10-07 DIAGNOSIS — Z45321 Encounter for adjustment and management of cochlear device: Secondary | ICD-10-CM | POA: Diagnosis not present

## 2022-10-21 DIAGNOSIS — Z45321 Encounter for adjustment and management of cochlear device: Secondary | ICD-10-CM | POA: Diagnosis not present

## 2022-11-06 DIAGNOSIS — H401131 Primary open-angle glaucoma, bilateral, mild stage: Secondary | ICD-10-CM | POA: Diagnosis not present

## 2022-11-11 DIAGNOSIS — Z923 Personal history of irradiation: Secondary | ICD-10-CM | POA: Diagnosis not present

## 2022-11-11 DIAGNOSIS — Z4889 Encounter for other specified surgical aftercare: Secondary | ICD-10-CM | POA: Diagnosis not present

## 2022-11-11 DIAGNOSIS — H90A32 Mixed conductive and sensorineural hearing loss, unilateral, left ear with restricted hearing on the contralateral side: Secondary | ICD-10-CM | POA: Diagnosis not present

## 2022-11-11 DIAGNOSIS — Z9621 Cochlear implant status: Secondary | ICD-10-CM | POA: Diagnosis not present

## 2022-11-11 DIAGNOSIS — H90A21 Sensorineural hearing loss, unilateral, right ear, with restricted hearing on the contralateral side: Secondary | ICD-10-CM | POA: Diagnosis not present

## 2022-12-11 DIAGNOSIS — Z1231 Encounter for screening mammogram for malignant neoplasm of breast: Secondary | ICD-10-CM | POA: Diagnosis not present

## 2022-12-19 ENCOUNTER — Encounter: Payer: Self-pay | Admitting: Podiatry

## 2022-12-23 ENCOUNTER — Ambulatory Visit: Payer: PPO | Admitting: Podiatry

## 2022-12-25 ENCOUNTER — Ambulatory Visit (INDEPENDENT_AMBULATORY_CARE_PROVIDER_SITE_OTHER): Payer: PPO

## 2022-12-25 ENCOUNTER — Ambulatory Visit: Payer: PPO | Admitting: Podiatry

## 2022-12-25 ENCOUNTER — Encounter: Payer: Self-pay | Admitting: Podiatry

## 2022-12-25 DIAGNOSIS — M2141 Flat foot [pes planus] (acquired), right foot: Secondary | ICD-10-CM | POA: Diagnosis not present

## 2022-12-25 DIAGNOSIS — M2042 Other hammer toe(s) (acquired), left foot: Secondary | ICD-10-CM

## 2022-12-25 DIAGNOSIS — M2032 Hallux varus (acquired), left foot: Secondary | ICD-10-CM | POA: Diagnosis not present

## 2022-12-25 DIAGNOSIS — M2142 Flat foot [pes planus] (acquired), left foot: Secondary | ICD-10-CM

## 2022-12-25 NOTE — Progress Notes (Signed)
  Subjective:  Patient ID: [name], female    DOB: 1941-08-25,   MRN: [id number]  Chief Complaint  Patient presents with   Foot Problem    Patient has flat feet and unsure how to take care of feet for years, patient has bunion on left foot, has had orthotics but patient feels they are ineffective     82 y.o. female presents for concern of bilateral flat feet that have been a problem for her for a very long time. She has had orthotics in the past but sounds like nothing fully custom. She has had bunion surgery on the left that did not go well. Relates she wants to continue walking and needs help with her feet but does not want surgery.  . Denies any other pedal complaints. Denies n/v/f/c.   Past Medical History:  Diagnosis Date   Arthritis    hands, legs   Cancer    Basal Cell left cheek, left earlobe   GERD (gastroesophageal reflux disease)    HOH (hard of hearing)    hearing aid left ear, deaf in right ear   Wears hearing aid in both ears     Objective:  Physical Exam: Vascular: DP/PT pulses 2/4 bilateral. CFT <3 seconds. Normal hair growth on digits. No edema.  Skin. No lacerations or abrasions bilateral feet.  Musculoskeletal: MMT 5/5 bilateral lower extremities in DF, PF, Inversion and Eversion. Deceased ROM in DF of ankle joint. Bilateral pes planus noted with increased eversion of the calcaneus bilateral and too many toes signs. Unable to perform single limb heel rise or double limb. Left foot with hallux varus and hammered digits 2-5  noted. Prominent navoicular tuberosity bilateral with tenderness to palaption.  Neurological: Sensation intact to light touch.   Assessment:   1. Pes planus of both feet   2. Acquired hallux varus of left foot   3. Hammertoe of left foot      Plan:  Patient was evaluated and treated and all questions answered. -Xrays reviewed. Bilateral pes planus noted with collapse of medial arch bilateral and abduction of the forefoot with talar  head uncoverage. Notable hallux varus deformity and retianed hardware in left foot from previous bunion surgery.  -Discussed treatement options; discussed pes planus deformity;conservative and  surgical  -Orthotics molded today and will determine if covered.  Discussed hammertoes and crest pad provided.  -Recommend good supportive shoes -Recommend daily stretching and icing -Patient to return to office as needed or sooner if condition worsens.   Louann Sjogren, DPM

## 2022-12-26 DIAGNOSIS — H35371 Puckering of macula, right eye: Secondary | ICD-10-CM | POA: Diagnosis not present

## 2022-12-26 DIAGNOSIS — H353131 Nonexudative age-related macular degeneration, bilateral, early dry stage: Secondary | ICD-10-CM | POA: Diagnosis not present

## 2022-12-26 DIAGNOSIS — H401121 Primary open-angle glaucoma, left eye, mild stage: Secondary | ICD-10-CM | POA: Diagnosis not present

## 2022-12-26 DIAGNOSIS — D3132 Benign neoplasm of left choroid: Secondary | ICD-10-CM | POA: Diagnosis not present

## 2022-12-26 DIAGNOSIS — H401112 Primary open-angle glaucoma, right eye, moderate stage: Secondary | ICD-10-CM | POA: Diagnosis not present

## 2023-01-17 IMAGING — CT CT CARDIAC CORONARY ARTERY CALCIUM SCORE
3 series · 14 of 20 positions shown, 15 images · non-contrast
Comparison: 04/09/2011 chest radiograph.  03/25/2006 chest CT
COMPARISON: 04/09/2011 chest radiograph.  03/25/2006 chest CT

Addendum:
EXAM:
OVER-READ INTERPRETATION  CT CHEST

The following report is an over-read performed by radiologist Dr.
Gael No [REDACTED] on 11/24/2020. This over-read
does not include interpretation of cardiac or coronary anatomy or
pathology. The coronary calcium score/coronary CTA interpretation by
the cardiologist is attached.
CLINICAL DATA: Cardiovascular Disease Risk stratification
Coronary Calcium Score
TECHNIQUE: A gated, non-contrast computed tomography scan of the heart was
performed using 3mm slice thickness. Axial images were analyzed on a
dedicated workstation. Calcium scoring of the coronary arteries was
performed using the Agatston method.

[Series 2: casc 3.0 bv41 2 bestdiast 71 % · axial · 0.35mm/px · z∈[-242,-168]mm · 4 of 43 slices shown, 5 images]
[im 9/43  vessel]
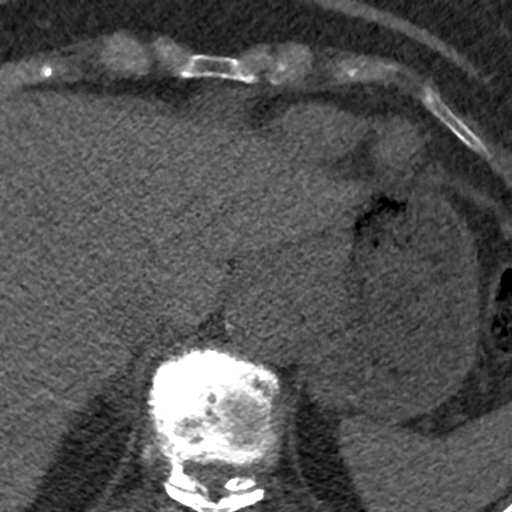
[im 9/43  lung]
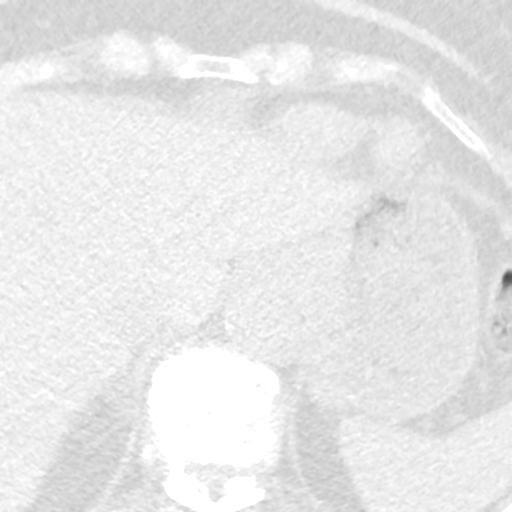
[im 17/43  vessel]
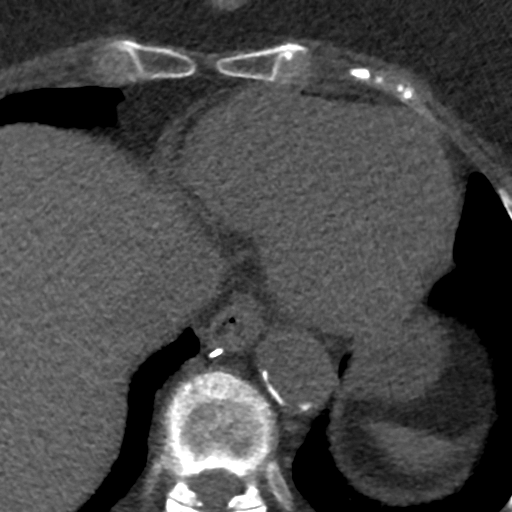
[im 26/43  vessel]
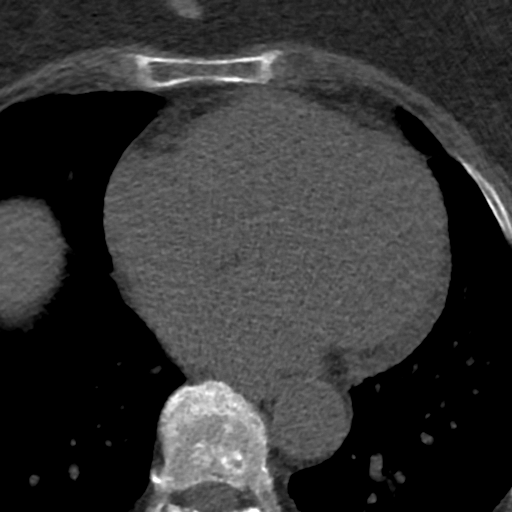
[im 34/43  vessel]
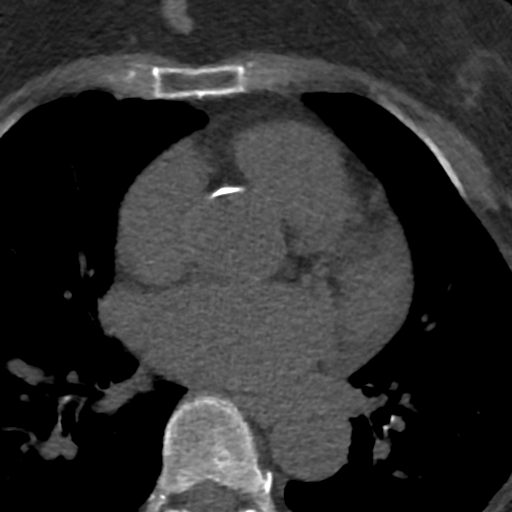

[Series 3: lung 71 % · axial · 0.68mm/px · z∈[-246,-162]mm · 5 of 43 slices shown]
[im 8/43  lung]
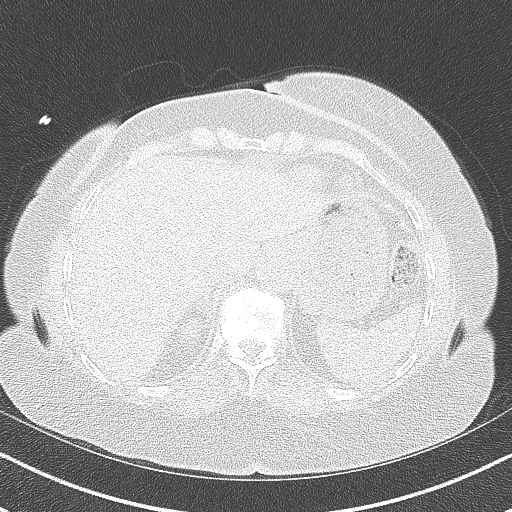
[im 15/43  lung]
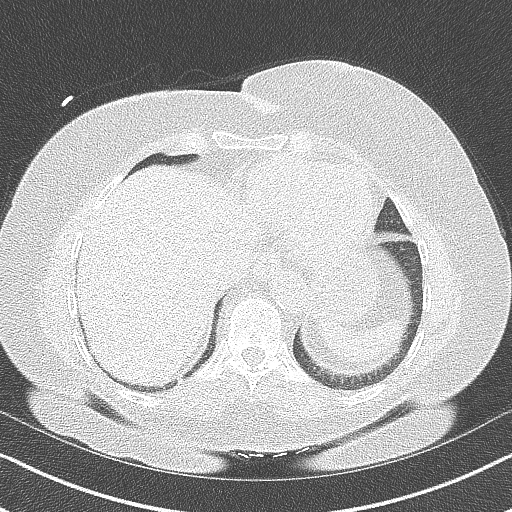
[im 22/43  lung]
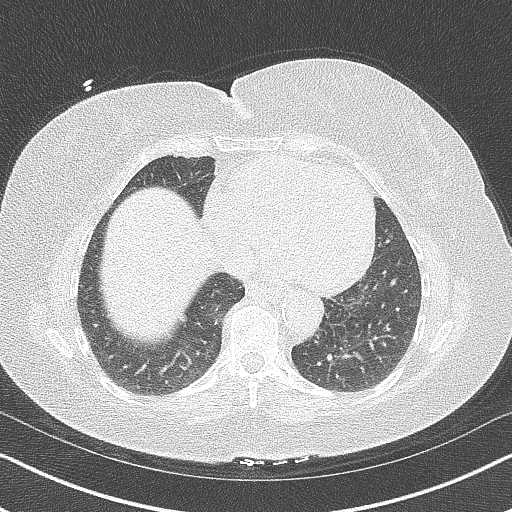
[im 29/43  lung]
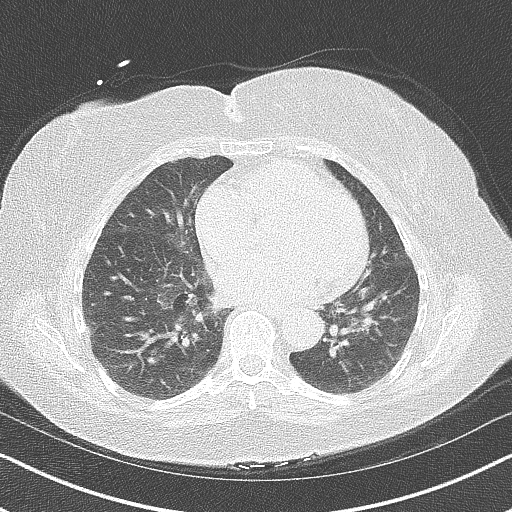
[im 36/43  lung]
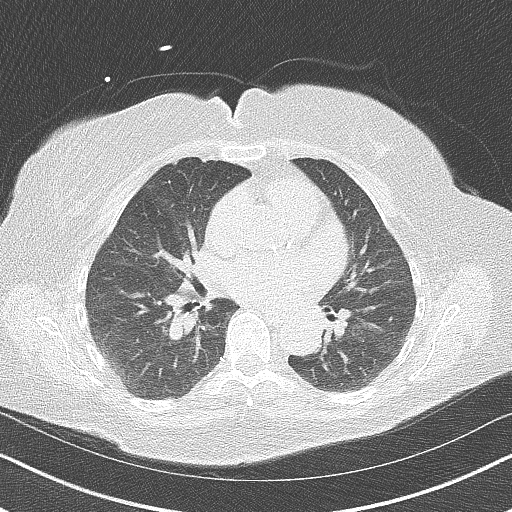

[Series 4: lung st 71 % · axial · 0.68mm/px · z∈[-246,-162]mm · 5 of 43 slices shown]
[im 8/43  lung]
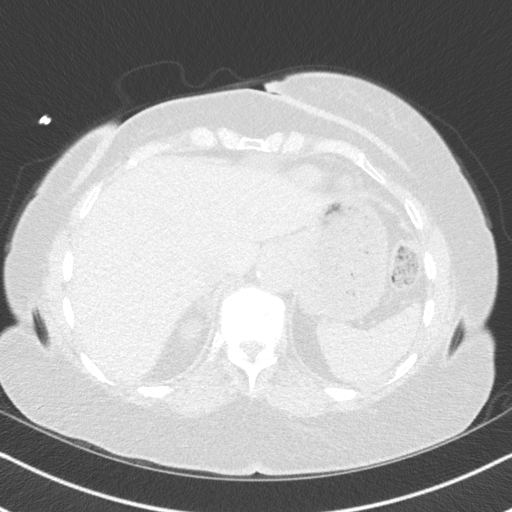
[im 15/43  lung]
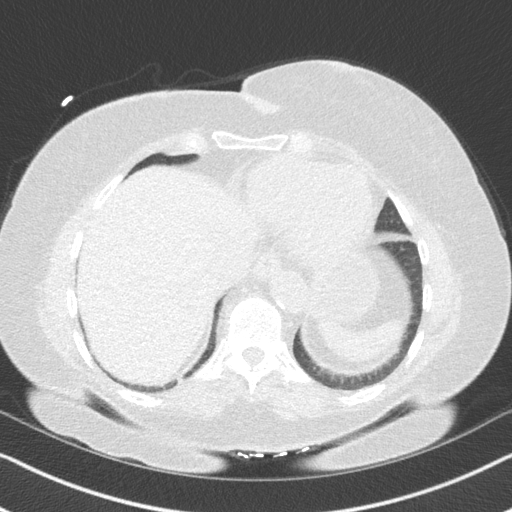
[im 22/43  lung]
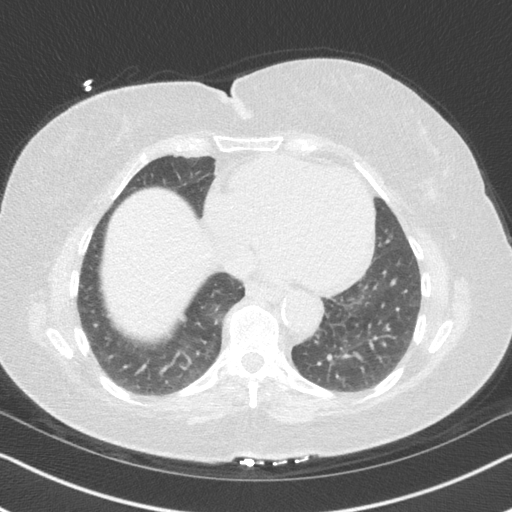
[im 29/43  lung]
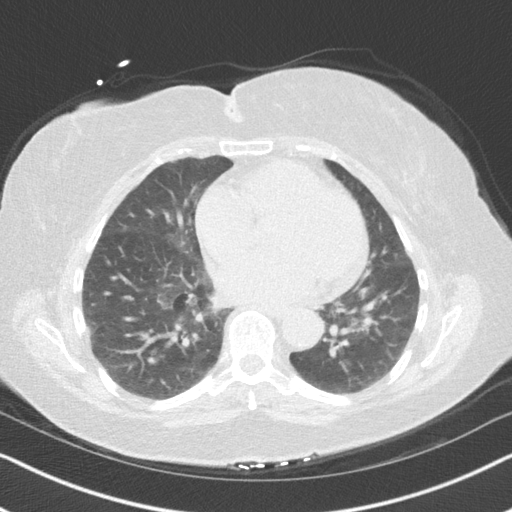
[im 36/43  lung]
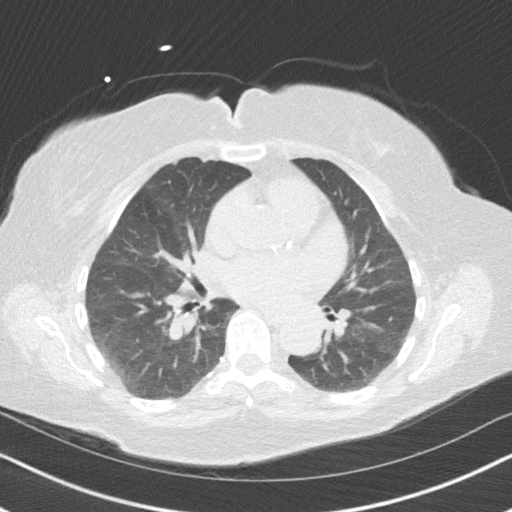

[14 of 20 positions shown; findings below may reference images not displayed]

FINDINGS: Vascular: Aortic atherosclerosis.  Tortuous thoracic aorta.

Mediastinum/Nodes: No imaged thoracic adenopathy.

Lungs/Pleura: No pleural fluid. A right middle lobe 5 mm pulmonary
nodule on [DATE] is similar to 03/25/2006 and can be presumed benign.

Upper Abdomen: Normal imaged portions of the liver, spleen, stomach,
adrenal glands, kidneys.

Musculoskeletal: No acute osseous abnormality. Lower thoracic
spondylosis. Mild right hemidiaphragm elevation.
IMPRESSION: 1.  No acute findings in the imaged extracardiac chest.
2.  Aortic Atherosclerosis ([id number].X).
FINDINGS: Coronary arteries: Normal origins.

Coronary Calcium Score:

Left main:

Left anterior descending artery:

Left circumflex artery:

Right coronary artery: 0

Total: 142

Percentile: 62nd

Pericardium: Normal.

Ascending Aorta: Normal caliber.  Aortic atherosclerosis.

Non-cardiac: See separate report from [REDACTED].
IMPRESSION: Coronary calcium score of 142. This was 62nd percentile for age-,
race-, and sex-matched controls.

Aortic atherosclerosis.



If CAC=0, it is reasonable to withhold statin therapy and reassess
in 5 to 10 years, as long as higher risk conditions are absent
(diabetes mellitus, family history of premature CHD in first degree
relatives (males <55 years; females <65 years), cigarette smoking,
or LDL >=190 mg/dL).

If CAC is 1 to 99, it is reasonable to initiate statin therapy for
patients >=55 years of age.

If CAC is >=100 or >=75th percentile, it is reasonable to initiate
statin therapy at any age.

Cardiology referral should be considered for patients with CAC
scores >=400 or >=75th percentile.

*7380 AHA/ACC/AACVPR/AAPA/ABC/DELOSSANTOS/QOLANI/HUNG/Rieger/MARIACHI/VELARDE/PEQINI
Guideline on the Management of Blood Cholesterol: A Report of the
American College of Cardiology/American Heart Association Task Force
on Clinical Practice Guidelines. J Am Coll Cardiol.
1289;73(24):8747-8739.

*** End of Addendum ***
EXAM:
OVER-READ INTERPRETATION  CT CHEST

The following report is an over-read performed by radiologist Dr.
Gael No [REDACTED] on 11/24/2020. This over-read
does not include interpretation of cardiac or coronary anatomy or
pathology. The coronary calcium score/coronary CTA interpretation by
the cardiologist is attached.
FINDINGS: Vascular: Aortic atherosclerosis.  Tortuous thoracic aorta.

Mediastinum/Nodes: No imaged thoracic adenopathy.

Lungs/Pleura: No pleural fluid. A right middle lobe 5 mm pulmonary
nodule on [DATE] is similar to 03/25/2006 and can be presumed benign.

Upper Abdomen: Normal imaged portions of the liver, spleen, stomach,
adrenal glands, kidneys.

Musculoskeletal: No acute osseous abnormality. Lower thoracic
spondylosis. Mild right hemidiaphragm elevation.
IMPRESSION: 1.  No acute findings in the imaged extracardiac chest.
2.  Aortic Atherosclerosis ([id number]-J3X.X).

## 2023-01-31 ENCOUNTER — Telehealth: Payer: Self-pay | Admitting: Podiatry

## 2023-01-31 NOTE — Telephone Encounter (Signed)
Received authorization from  [employer] for orthotics  Berkley Harvey  [id number] 01/20/23-04/20/23   Documentation sent to scan center

## 2023-02-06 ENCOUNTER — Ambulatory Visit: Payer: PPO | Admitting: Podiatry

## 2023-02-06 DIAGNOSIS — M2142 Flat foot [pes planus] (acquired), left foot: Secondary | ICD-10-CM

## 2023-02-06 DIAGNOSIS — M2042 Other hammer toe(s) (acquired), left foot: Secondary | ICD-10-CM

## 2023-02-06 DIAGNOSIS — M2032 Hallux varus (acquired), left foot: Secondary | ICD-10-CM

## 2023-02-06 NOTE — Progress Notes (Signed)
Patient presents today to pick up custom orthotics   Patient was dispensed 1 pair of custom orthotics. Fit was satisfactory. Instructions for break-in and wear was reviewed and a copy was given to the patient.   Re-appointment for regularly scheduled diabetic foot care visits or if they should experience any trouble with the shoes or insoles.  

## 2023-03-31 DIAGNOSIS — Z45321 Encounter for adjustment and management of cochlear device: Secondary | ICD-10-CM | POA: Diagnosis not present

## 2023-03-31 DIAGNOSIS — H906 Mixed conductive and sensorineural hearing loss, bilateral: Secondary | ICD-10-CM | POA: Diagnosis not present

## 2023-04-03 DIAGNOSIS — Z4889 Encounter for other specified surgical aftercare: Secondary | ICD-10-CM | POA: Diagnosis not present

## 2023-04-03 DIAGNOSIS — Z9621 Cochlear implant status: Secondary | ICD-10-CM | POA: Diagnosis not present

## 2023-04-03 DIAGNOSIS — D329 Benign neoplasm of meninges, unspecified: Secondary | ICD-10-CM | POA: Diagnosis not present

## 2023-04-03 DIAGNOSIS — Z923 Personal history of irradiation: Secondary | ICD-10-CM | POA: Diagnosis not present

## 2023-04-03 DIAGNOSIS — H90A21 Sensorineural hearing loss, unilateral, right ear, with restricted hearing on the contralateral side: Secondary | ICD-10-CM | POA: Diagnosis not present

## 2023-04-03 DIAGNOSIS — H90A32 Mixed conductive and sensorineural hearing loss, unilateral, left ear with restricted hearing on the contralateral side: Secondary | ICD-10-CM | POA: Diagnosis not present

## 2023-06-30 DIAGNOSIS — H353131 Nonexudative age-related macular degeneration, bilateral, early dry stage: Secondary | ICD-10-CM | POA: Diagnosis not present

## 2023-06-30 DIAGNOSIS — H26492 Other secondary cataract, left eye: Secondary | ICD-10-CM | POA: Diagnosis not present

## 2023-06-30 DIAGNOSIS — D3132 Benign neoplasm of left choroid: Secondary | ICD-10-CM | POA: Diagnosis not present

## 2023-06-30 DIAGNOSIS — H401112 Primary open-angle glaucoma, right eye, moderate stage: Secondary | ICD-10-CM | POA: Diagnosis not present

## 2023-07-21 DIAGNOSIS — H26492 Other secondary cataract, left eye: Secondary | ICD-10-CM | POA: Diagnosis not present

## 2023-08-08 DIAGNOSIS — M79642 Pain in left hand: Secondary | ICD-10-CM | POA: Diagnosis not present

## 2023-08-08 DIAGNOSIS — M79641 Pain in right hand: Secondary | ICD-10-CM | POA: Diagnosis not present

## 2023-08-08 DIAGNOSIS — R030 Elevated blood-pressure reading, without diagnosis of hypertension: Secondary | ICD-10-CM | POA: Diagnosis not present

## 2023-08-08 DIAGNOSIS — Z6841 Body Mass Index (BMI) 40.0 and over, adult: Secondary | ICD-10-CM | POA: Diagnosis not present

## 2023-08-08 DIAGNOSIS — Z7689 Persons encountering health services in other specified circumstances: Secondary | ICD-10-CM | POA: Diagnosis not present

## 2023-12-17 DIAGNOSIS — Z45321 Encounter for adjustment and management of cochlear device: Secondary | ICD-10-CM | POA: Diagnosis not present

## 2023-12-22 DIAGNOSIS — Z1231 Encounter for screening mammogram for malignant neoplasm of breast: Secondary | ICD-10-CM | POA: Diagnosis not present

## 2024-01-29 DIAGNOSIS — M7062 Trochanteric bursitis, left hip: Secondary | ICD-10-CM | POA: Diagnosis not present

## 2024-04-05 DIAGNOSIS — H90A32 Mixed conductive and sensorineural hearing loss, unilateral, left ear with restricted hearing on the contralateral side: Secondary | ICD-10-CM | POA: Diagnosis not present

## 2024-04-05 DIAGNOSIS — H9072 Mixed conductive and sensorineural hearing loss, unilateral, left ear, with unrestricted hearing on the contralateral side: Secondary | ICD-10-CM | POA: Diagnosis not present

## 2024-04-05 DIAGNOSIS — Z45321 Encounter for adjustment and management of cochlear device: Secondary | ICD-10-CM | POA: Diagnosis not present

## 2024-04-08 DIAGNOSIS — Z923 Personal history of irradiation: Secondary | ICD-10-CM | POA: Diagnosis not present

## 2024-04-08 DIAGNOSIS — D329 Benign neoplasm of meninges, unspecified: Secondary | ICD-10-CM | POA: Diagnosis not present

## 2024-04-08 DIAGNOSIS — H90A32 Mixed conductive and sensorineural hearing loss, unilateral, left ear with restricted hearing on the contralateral side: Secondary | ICD-10-CM | POA: Diagnosis not present

## 2024-04-08 DIAGNOSIS — Z4889 Encounter for other specified surgical aftercare: Secondary | ICD-10-CM | POA: Diagnosis not present

## 2024-04-08 DIAGNOSIS — Z9621 Cochlear implant status: Secondary | ICD-10-CM | POA: Diagnosis not present

## 2024-04-08 DIAGNOSIS — H90A21 Sensorineural hearing loss, unilateral, right ear, with restricted hearing on the contralateral side: Secondary | ICD-10-CM | POA: Diagnosis not present

## 2024-04-12 DIAGNOSIS — H401121 Primary open-angle glaucoma, left eye, mild stage: Secondary | ICD-10-CM | POA: Diagnosis not present

## 2024-04-12 DIAGNOSIS — H401112 Primary open-angle glaucoma, right eye, moderate stage: Secondary | ICD-10-CM | POA: Diagnosis not present

## 2024-04-29 DIAGNOSIS — H353131 Nonexudative age-related macular degeneration, bilateral, early dry stage: Secondary | ICD-10-CM | POA: Diagnosis not present

## 2024-04-29 DIAGNOSIS — H401121 Primary open-angle glaucoma, left eye, mild stage: Secondary | ICD-10-CM | POA: Diagnosis not present

## 2024-04-29 DIAGNOSIS — H43813 Vitreous degeneration, bilateral: Secondary | ICD-10-CM | POA: Diagnosis not present

## 2024-04-29 DIAGNOSIS — H401112 Primary open-angle glaucoma, right eye, moderate stage: Secondary | ICD-10-CM | POA: Diagnosis not present

## 2024-06-08 DIAGNOSIS — R195 Other fecal abnormalities: Secondary | ICD-10-CM | POA: Diagnosis not present

## 2024-06-08 DIAGNOSIS — Z860101 Personal history of adenomatous and serrated colon polyps: Secondary | ICD-10-CM | POA: Diagnosis not present

## 2024-07-05 DIAGNOSIS — D329 Benign neoplasm of meninges, unspecified: Secondary | ICD-10-CM | POA: Diagnosis not present

## 2024-07-13 DIAGNOSIS — K5730 Diverticulosis of large intestine without perforation or abscess without bleeding: Secondary | ICD-10-CM | POA: Diagnosis not present

## 2024-07-13 DIAGNOSIS — Z800 Family history of malignant neoplasm of digestive organs: Secondary | ICD-10-CM | POA: Diagnosis not present

## 2024-07-13 DIAGNOSIS — Z09 Encounter for follow-up examination after completed treatment for conditions other than malignant neoplasm: Secondary | ICD-10-CM | POA: Diagnosis not present

## 2024-07-13 DIAGNOSIS — Z860101 Personal history of adenomatous and serrated colon polyps: Secondary | ICD-10-CM | POA: Diagnosis not present

## 2024-07-13 DIAGNOSIS — K635 Polyp of colon: Secondary | ICD-10-CM | POA: Diagnosis not present

## 2024-07-15 DIAGNOSIS — K635 Polyp of colon: Secondary | ICD-10-CM | POA: Diagnosis not present

## 2024-08-09 DIAGNOSIS — E785 Hyperlipidemia, unspecified: Secondary | ICD-10-CM | POA: Diagnosis not present

## 2024-08-09 DIAGNOSIS — Z0000 Encounter for general adult medical examination without abnormal findings: Secondary | ICD-10-CM | POA: Diagnosis not present

## 2024-08-09 DIAGNOSIS — Z131 Encounter for screening for diabetes mellitus: Secondary | ICD-10-CM | POA: Diagnosis not present

## 2024-08-09 DIAGNOSIS — E559 Vitamin D deficiency, unspecified: Secondary | ICD-10-CM | POA: Diagnosis not present
